# Patient Record
Sex: Male | Born: 1960 | Race: White | Hispanic: No | Smoking: Never smoker
Health system: Southern US, Community
[De-identification: ages and names within clinical notes are randomized; demographics above are authoritative.]

## PROBLEM LIST (undated history)

## (undated) DIAGNOSIS — B1920 Unspecified viral hepatitis C without hepatic coma: Secondary | ICD-10-CM

## (undated) DIAGNOSIS — M549 Dorsalgia, unspecified: Secondary | ICD-10-CM

## (undated) DIAGNOSIS — B1910 Unspecified viral hepatitis B without hepatic coma: Secondary | ICD-10-CM

## (undated) DIAGNOSIS — F1020 Alcohol dependence, uncomplicated: Secondary | ICD-10-CM

## (undated) HISTORY — PX: HERNIA REPAIR: SHX51

---

## 2010-07-27 ENCOUNTER — Emergency Department: Payer: Self-pay | Admitting: Emergency Medicine

## 2010-10-24 ENCOUNTER — Emergency Department: Payer: Self-pay | Admitting: Unknown Physician Specialty

## 2010-11-20 ENCOUNTER — Ambulatory Visit: Payer: Self-pay | Admitting: Specialist

## 2011-04-04 ENCOUNTER — Emergency Department: Payer: Self-pay | Admitting: *Deleted

## 2011-06-29 ENCOUNTER — Emergency Department: Payer: Self-pay | Admitting: Unknown Physician Specialty

## 2011-07-03 ENCOUNTER — Emergency Department: Payer: Self-pay | Admitting: Emergency Medicine

## 2011-10-02 ENCOUNTER — Emergency Department: Payer: Self-pay | Admitting: Emergency Medicine

## 2012-01-25 ENCOUNTER — Emergency Department: Payer: Self-pay | Admitting: *Deleted

## 2012-01-25 LAB — DRUG SCREEN, URINE
Amphetamines, Ur Screen: NEGATIVE (ref ?–1000)
Benzodiazepine, Ur Scrn: NEGATIVE (ref ?–200)
Cannabinoid 50 Ng, Ur ~~LOC~~: NEGATIVE (ref ?–50)
Cocaine Metabolite,Ur ~~LOC~~: POSITIVE (ref ?–300)
MDMA (Ecstasy)Ur Screen: NEGATIVE (ref ?–500)
Methadone, Ur Screen: NEGATIVE (ref ?–300)
Phencyclidine (PCP) Ur S: NEGATIVE (ref ?–25)
Tricyclic, Ur Screen: NEGATIVE (ref ?–1000)

## 2012-01-25 LAB — COMPREHENSIVE METABOLIC PANEL
Alkaline Phosphatase: 57 U/L (ref 50–136)
Bilirubin,Total: 0.4 mg/dL (ref 0.2–1.0)
Chloride: 105 mmol/L (ref 98–107)
Co2: 26 mmol/L (ref 21–32)
Creatinine: 0.93 mg/dL (ref 0.60–1.30)
EGFR (African American): >60
EGFR (Non-African Amer.): >60
Osmolality: 279 (ref 275–301)
Potassium: 3.5 mmol/L (ref 3.5–5.1)

## 2012-01-25 LAB — CBC
HGB: 15.5 g/dL (ref 13.0–18.0)
MCH: 32.1 pg (ref 26.0–34.0)
MCHC: 33.5 g/dL (ref 32.0–36.0)
Platelet: 180 10*3/uL (ref 150–440)
RBC: 4.84 10*6/uL (ref 4.40–5.90)
RDW: 13.4 % (ref 11.5–14.5)
WBC: 7.2 10*3/uL (ref 3.8–10.6)

## 2012-01-25 LAB — ETHANOL
Ethanol %: 0.159 % — ABNORMAL HIGH (ref 0.000–0.080)
Ethanol: 159 mg/dL

## 2012-01-25 LAB — SALICYLATE LEVEL: Salicylates, Serum: <1.7 mg/dL

## 2012-01-25 LAB — TSH: Thyroid Stimulating Horm: 3.51 u[IU]/mL

## 2012-09-07 IMAGING — CR DG CHEST 1V PORT
1 series · 1 of 1 positions shown · non-contrast
Comparison: none

REASON FOR EXAM: chest pain
COMMENTS:

PROCEDURE:     DXR - DXR PORTABLE CHEST SINGLE VIEW  - October 24, 2010  [DATE]
RESULT:     The lungs are clear. The cardiac silhouette and visualized bony
skeleton are unremarkable.

[view not recorded]
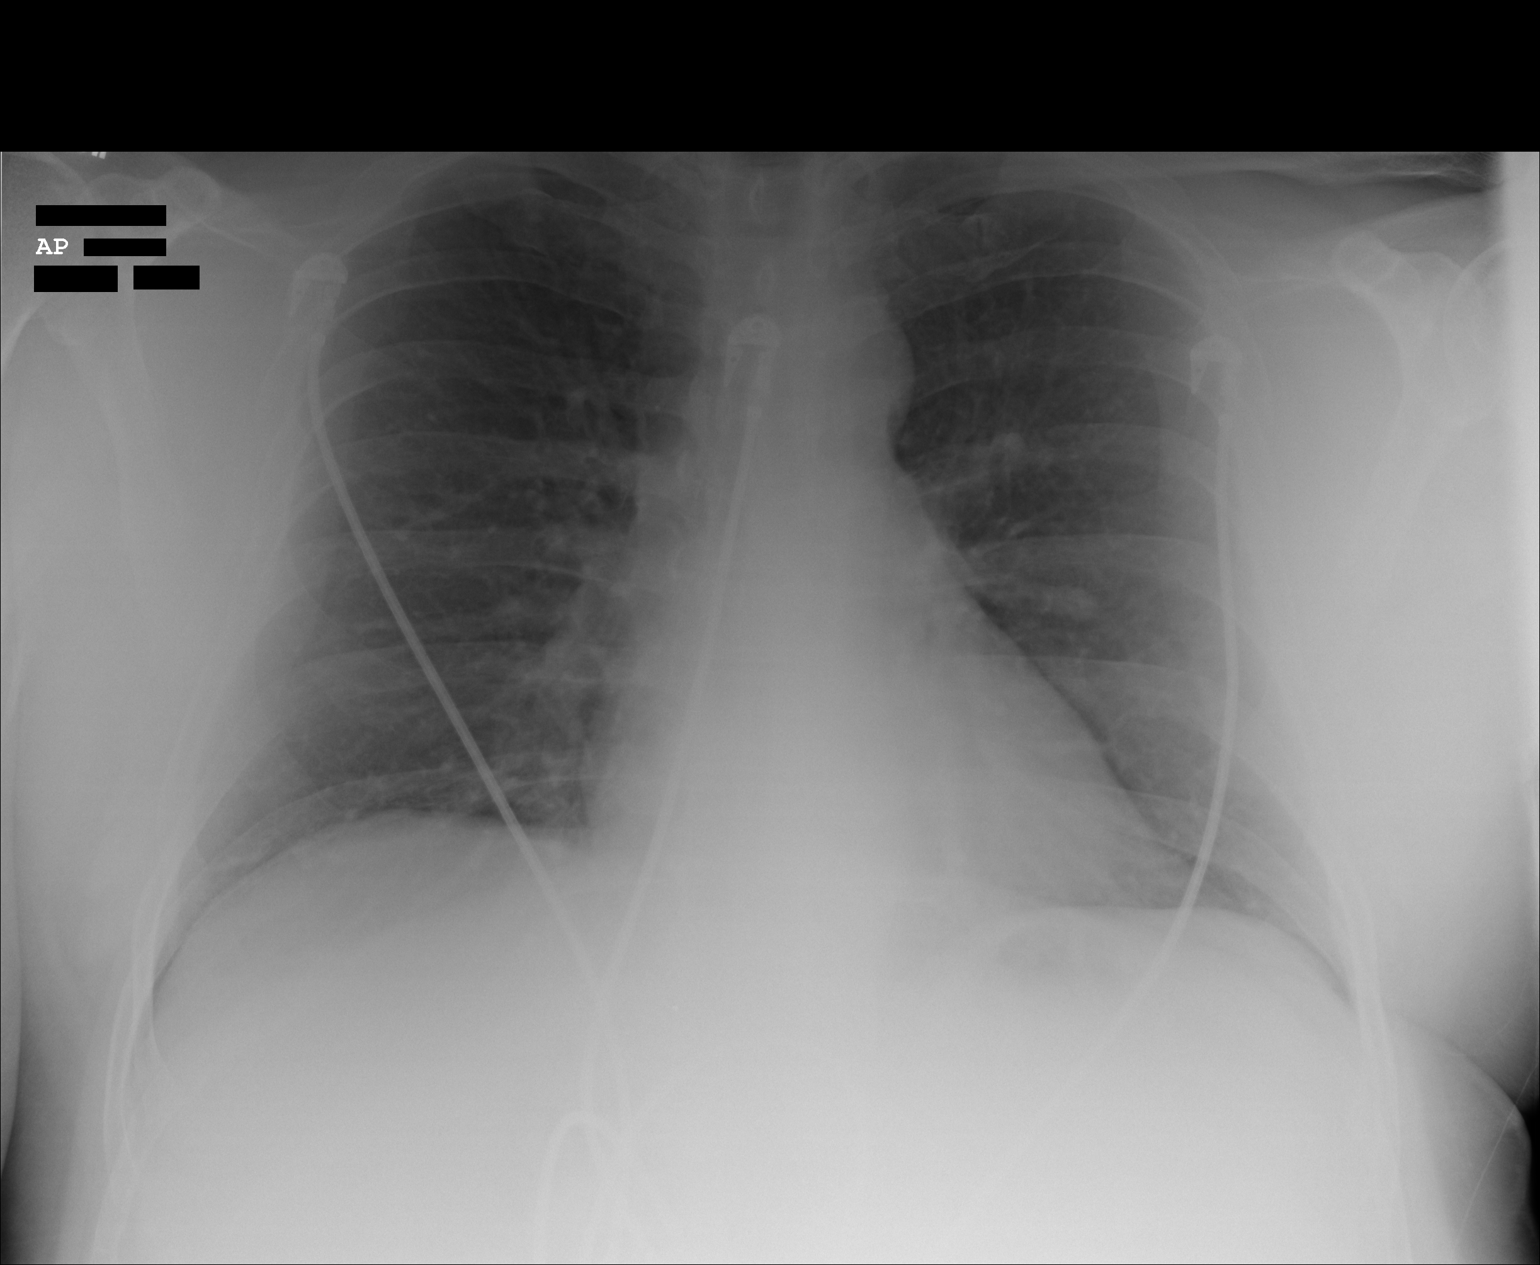

[1 of 1 positions shown; findings below may reference images not displayed]

IMPRESSION: 1. Chest radiograph without evidence of acute cardiopulmonary disease.

## 2012-09-07 IMAGING — CT CT CHEST-ABD-PELV W/ CM
1 of 2 series · 14 of 32 positions shown, 19 images · IV contrast (APPLIED)
Comparison: None

REASON FOR EXAM: (1) chest pain; (2) back pain
COMMENTS:

PROCEDURE:     CT  - CT CHEST ABDOMEN AND PELVIS W  - October 24, 2010  [DATE]
RESULT:     CT CHEST, ABDOMEN, AND PELVIS
HISTORY: Chest pain
TECHNIQUE: Multiple axial images obtained from the thoracic inlet to the
pubic symphysis, without p.o. contrast and with 100 ml of [id number]
intravenous contrast.

[Series 5: soft tissue · axial · 0.82mm/px · z∈[-704,-95]mm · 14 of 231 slices shown, 19 images]
[im 14/231  mediastinal]
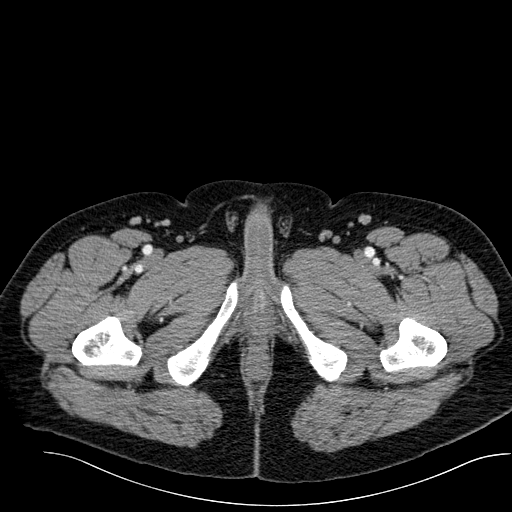
[im 14/231  bone]
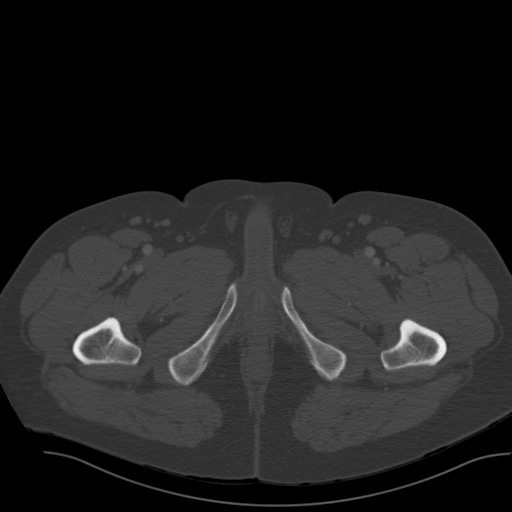
[im 41/231  mediastinal]
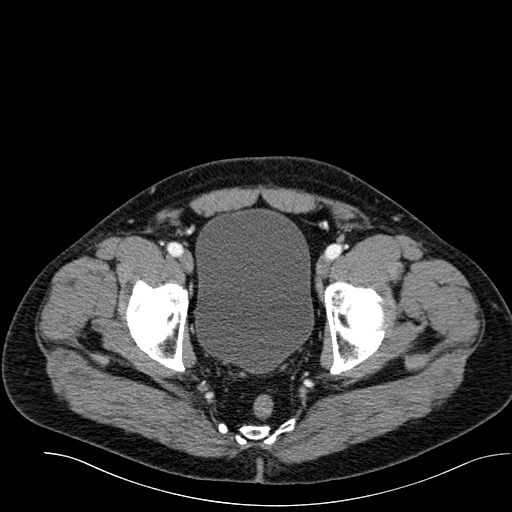
[im 55/231  mediastinal]
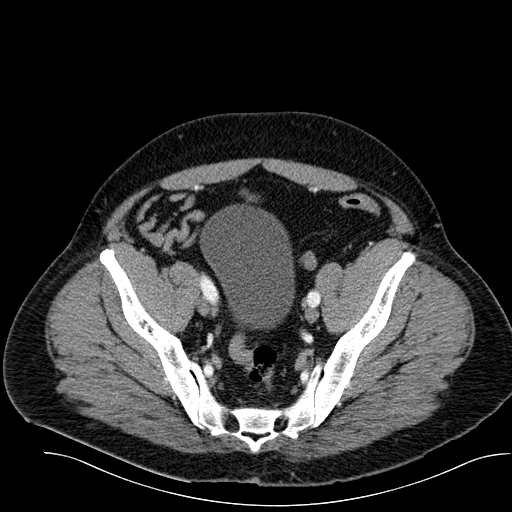
[im 77/231  mediastinal]
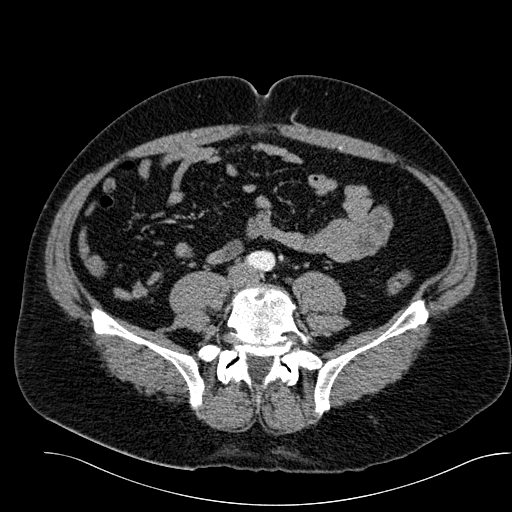
[im 82/231  mediastinal]
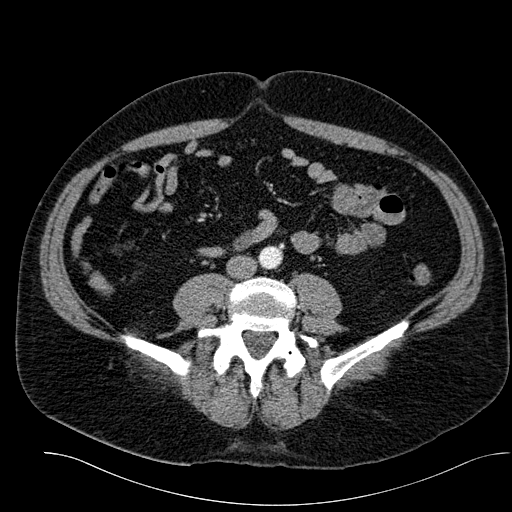
[im 109/231  mediastinal]
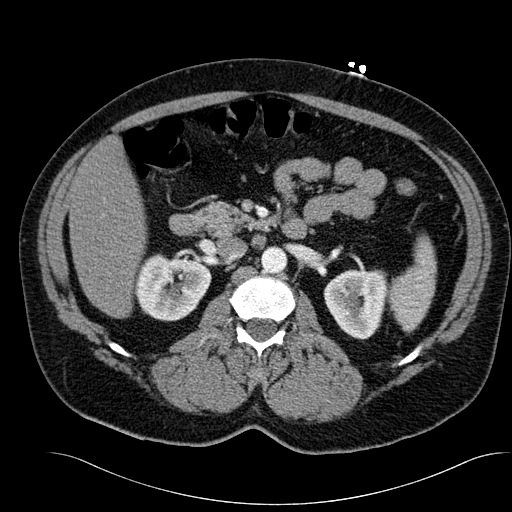
[im 113/231  mediastinal]
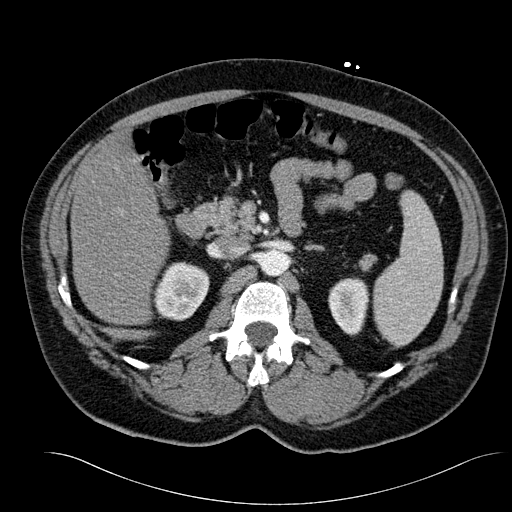
[im 122/231  mediastinal]
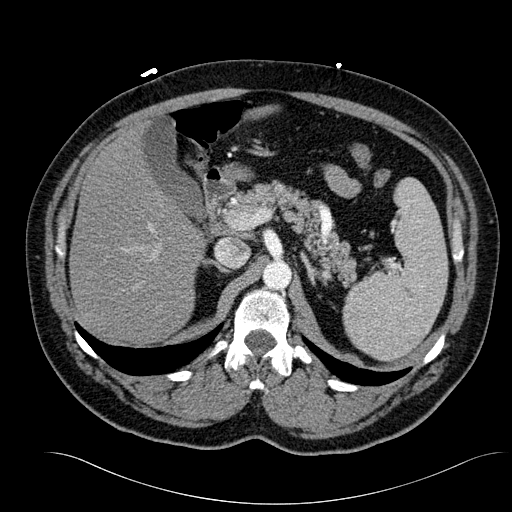
[im 149/231  mediastinal]
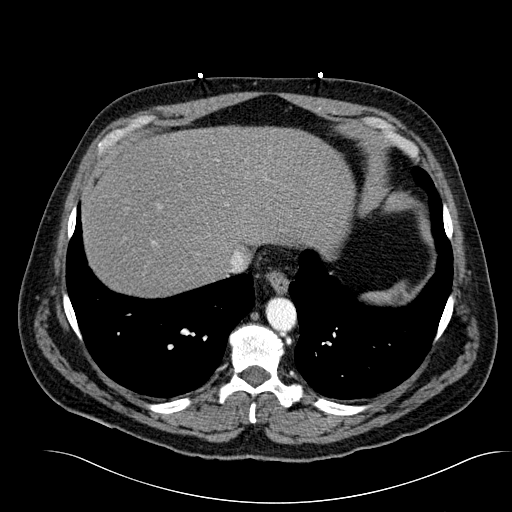
[im 149/231  bone]
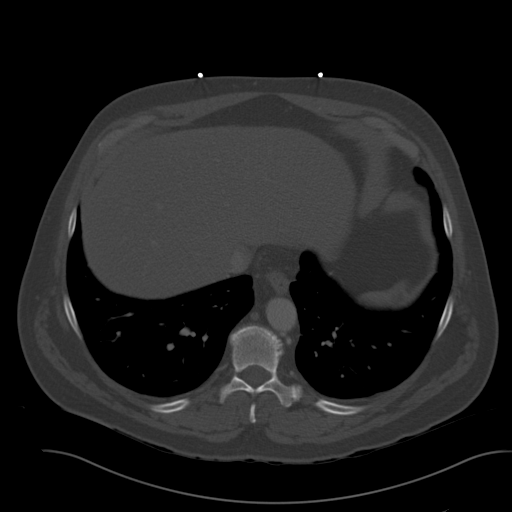
[im 154/231  mediastinal]
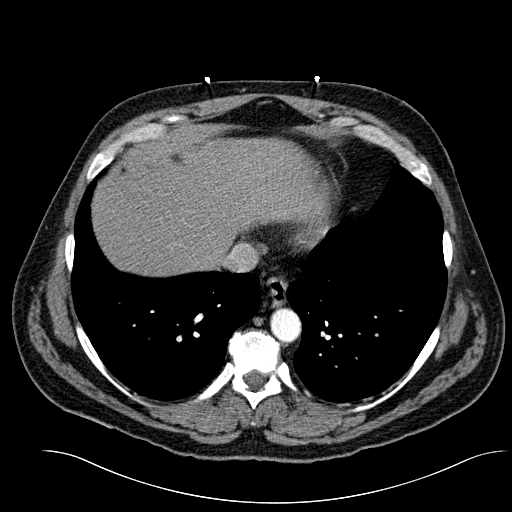
[im 176/231  mediastinal]
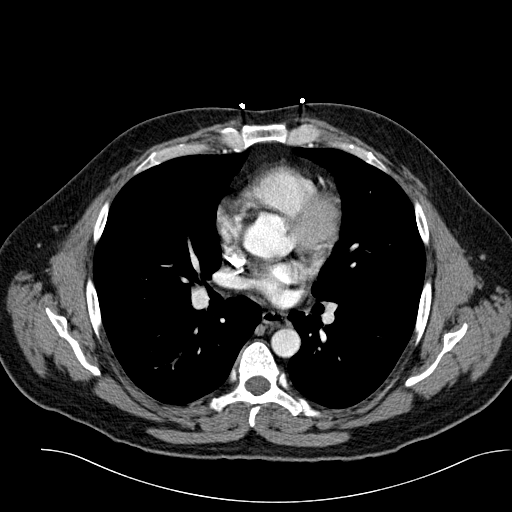
[im 176/231  lung]
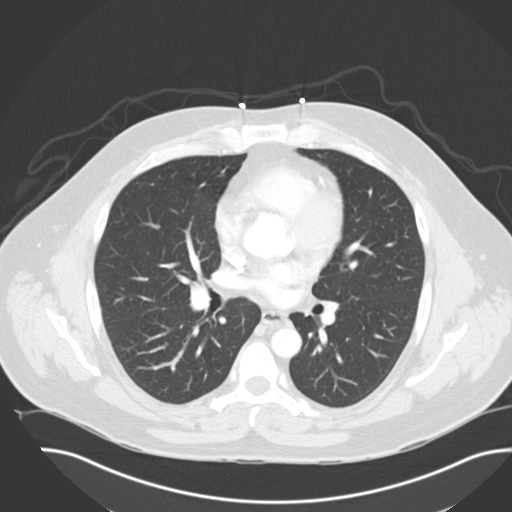
[im 190/231  mediastinal]
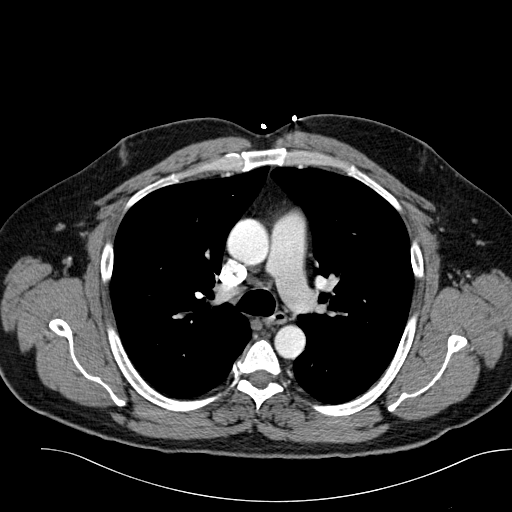
[im 190/231  lung]
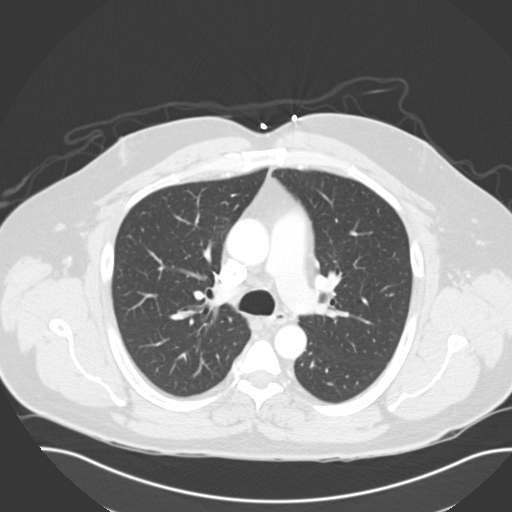
[im 203/231  lung]
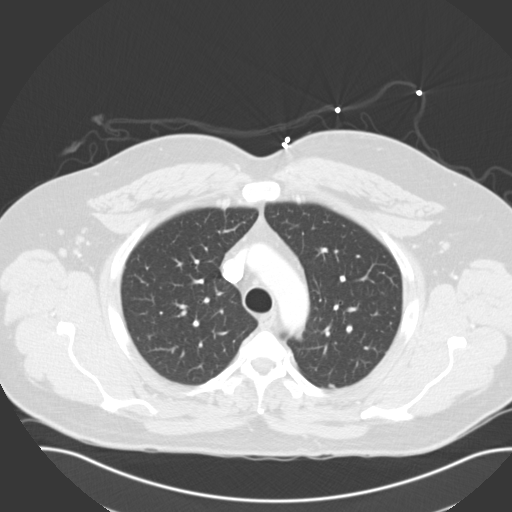
[im 217/231  mediastinal]
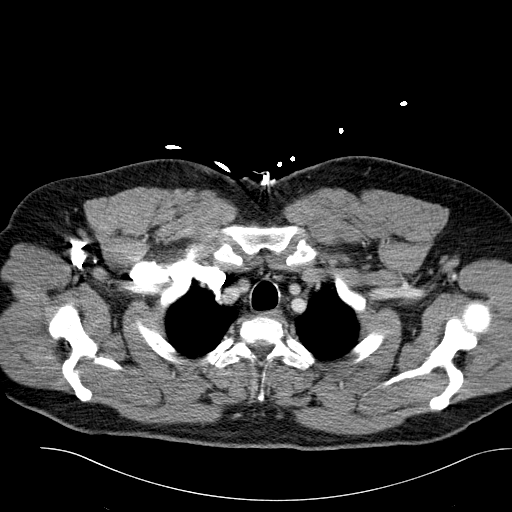
[im 217/231  lung]
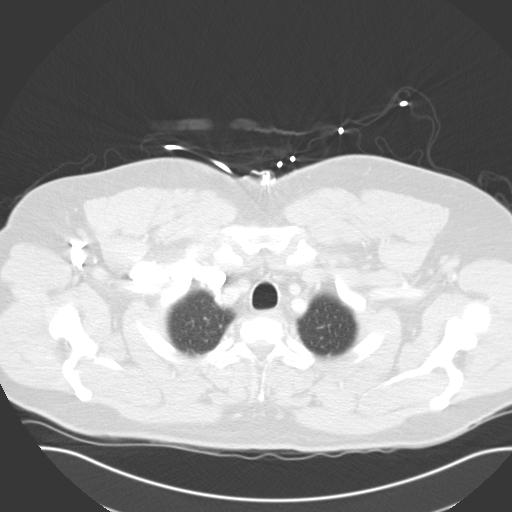

[14 of 32 positions shown; findings below may reference images not displayed]

FINDINGS: CHEST:

The lungs are clear. There is no focal mass. There is no focal parenchymal
opacity, pleural effusion, or pneumothorax.

The heart size is normal. There is no pericardial effusion.

There are no pathologically enlarged mediastinal, hilar, or axillary lymph
nodes.

The osseous structures demonstrate no focal abnormality.

ABDOMEN/PELVIS:

The liver is diffusely low in attenuation likely secondary to hepatic
steatosis. There is no intrahepatic or extrahepatic biliary ductal
dilatation. The gallbladder is unremarkable. The spleen demonstrates no
focal abnormality. The kidneys, adrenal glands, pancreas are normal. The
bladder is unremarkable.

The  unopacified stomach, duodenum, small intestine, and large intestine
demonstrate no gross abnormality, but evaluation is limited secondary to
lack of enteric contrast. There is no pneumoperitoneum, pneumatosis, or
portal venous gas. There is no abdominal or pelvic free fluid. There is no
lymphadenopathy.

The abdominal aorta is normal in caliber .

There is grade 1 anterolisthesis of L5 on S1. There is severe facet
arthropathy at L5-S1. There is bilateral foraminal narrowing at L5-S1.
IMPRESSION: 1. No acute disease of the chest, abdomen or pelvis.

2. Grade 1 anterolisthesis of L5 on S1 likely secondary to severe bilateral
facet arthropathy. There is bilateral severe foraminal narrowing at L5-S1.
If there is persistent concern regarding radiculopathy recommend further
evaluation with MRI.

3. Hepatic steatosis.

## 2015-11-13 ENCOUNTER — Emergency Department: Payer: Medicaid Other

## 2015-11-13 ENCOUNTER — Observation Stay
Admission: EM | Admit: 2015-11-13 | Discharge: 2015-11-14 | Disposition: A | Discharge status: Home / Self Care | Payer: Medicaid Other | Attending: Internal Medicine | Admitting: Internal Medicine

## 2015-11-13 DIAGNOSIS — R0902 Hypoxemia: Secondary | ICD-10-CM

## 2015-11-13 DIAGNOSIS — T50901A Poisoning by unspecified drugs, medicaments and biological substances, accidental (unintentional), initial encounter: Secondary | ICD-10-CM | POA: Diagnosis present

## 2015-11-13 DIAGNOSIS — R4189 Other symptoms and signs involving cognitive functions and awareness: Secondary | ICD-10-CM

## 2015-11-13 DIAGNOSIS — Z9889 Other specified postprocedural states: Secondary | ICD-10-CM | POA: Insufficient documentation

## 2015-11-13 DIAGNOSIS — B1920 Unspecified viral hepatitis C without hepatic coma: Secondary | ICD-10-CM | POA: Insufficient documentation

## 2015-11-13 DIAGNOSIS — T401X1A Poisoning by heroin, accidental (unintentional), initial encounter: Secondary | ICD-10-CM | POA: Insufficient documentation

## 2015-11-13 DIAGNOSIS — Y9259 Other trade areas as the place of occurrence of the external cause: Secondary | ICD-10-CM | POA: Insufficient documentation

## 2015-11-13 DIAGNOSIS — J9601 Acute respiratory failure with hypoxia: Secondary | ICD-10-CM | POA: Diagnosis not present

## 2015-11-13 DIAGNOSIS — N179 Acute kidney failure, unspecified: Secondary | ICD-10-CM | POA: Diagnosis present

## 2015-11-13 DIAGNOSIS — F10929 Alcohol use, unspecified with intoxication, unspecified: Secondary | ICD-10-CM | POA: Diagnosis present

## 2015-11-13 DIAGNOSIS — R404 Transient alteration of awareness: Secondary | ICD-10-CM | POA: Diagnosis present

## 2015-11-13 DIAGNOSIS — R0689 Other abnormalities of breathing: Secondary | ICD-10-CM

## 2015-11-13 DIAGNOSIS — E876 Hypokalemia: Secondary | ICD-10-CM | POA: Diagnosis present

## 2015-11-13 DIAGNOSIS — T40601A Poisoning by unspecified narcotics, accidental (unintentional), initial encounter: Secondary | ICD-10-CM | POA: Diagnosis present

## 2015-11-13 HISTORY — DX: Dorsalgia, unspecified: M54.9

## 2015-11-13 HISTORY — DX: Unspecified viral hepatitis C without hepatic coma: B19.20

## 2015-11-13 LAB — CBC WITH DIFFERENTIAL/PLATELET
Basophils Absolute: 0.1 10*3/uL (ref 0–0.1)
Basophils Relative: 1 %
EOS ABS: 0.2 10*3/uL (ref 0–0.7)
Eosinophils Relative: 2 %
HEMATOCRIT: 44.1 % (ref 40.0–52.0)
HEMOGLOBIN: 14.9 g/dL (ref 13.0–18.0)
LYMPHS ABS: 5.6 10*3/uL — AB (ref 1.0–3.6)
Lymphocytes Relative: 52 %
MCH: 33.5 pg (ref 26.0–34.0)
MCHC: 33.8 g/dL (ref 32.0–36.0)
MCV: 98.9 fL (ref 80.0–100.0)
MONO ABS: 1.2 10*3/uL — AB (ref 0.2–1.0)
MONOS PCT: 11 %
Neutro Abs: 3.6 10*3/uL (ref 1.4–6.5)
Neutrophils Relative %: 34 %
Platelets: 174 10*3/uL (ref 150–440)
RBC: 4.46 MIL/uL (ref 4.40–5.90)
RDW: 13.6 % (ref 11.5–14.5)
WBC: 10.8 10*3/uL — ABNORMAL HIGH (ref 3.8–10.6)

## 2015-11-13 MED ORDER — SODIUM CHLORIDE 0.9 % IV BOLUS (SEPSIS)
1000.0000 mL | Freq: Once | INTRAVENOUS | Status: AC
  Administered 2015-11-14: 1000 mL via INTRAVENOUS

## 2015-11-13 NOTE — ED Provider Notes (Signed)
[facility] Emergency Department Provider Note  ____________________________________________  Time seen: Approximately 11:46 PM  I have reviewed the triage vital signs and the nursing notes.   HISTORY  Chief Complaint Drug Overdose  History limited by patient unresponsive  HPI Oscar Poole is a 55 y.o. male brought to the ED via EMS from [facility] for unresponsive, likely secondary to heroin overdose.EMS was called to the local hotel where the collar reported that the patient had been using heroin and became unresponsive. On EMS arrival, patient was breathing 2 times per minute. EMS gave 2 mg Narcan intranasally. Upon IV placement, EMS then gave 1 mg IV Narcan with good response. Patient arrives to the emergency department breathing spontaneously and arousable to verbal stimuli. When questioned, patient denies drug use and complains of chronic back pain. Further details unobtainable secondary to decreased level of consciousness.   Past medical history Unknown  There are no active problems to display for this patient.   No past surgical history on file.  No current outpatient prescriptions on file.  Allergies Review of patient's allergies indicates no known allergies.  No family history on file.  Social History Social History  Substance Use Topics  . Smoking status: Not on file  . Smokeless tobacco: Not on file  . Alcohol Use: Not on file    Review of Systems  Constitutional: No fever/chills Eyes: No visual changes. ENT: No sore throat. Cardiovascular: Denies chest pain. Respiratory: Denies shortness of breath. Gastrointestinal: No abdominal pain.  No nausea, no vomiting.  No diarrhea.  No constipation. Genitourinary: Negative for dysuria. Musculoskeletal: Positive for back pain. Skin: Negative for rash. Neurological: Negative for headaches, focal weakness or numbness.  Limited secondary to decreased level of consciousness; 10-point ROS  otherwise negative.  ____________________________________________   PHYSICAL EXAM:  VITAL SIGNS: ED Triage Vitals  Enc Vitals Group     BP 11/13/15 2343 118/90 mmHg     Pulse Rate 11/13/15 2343 94     Resp 11/13/15 2343 24     Temp --      Temp src --      SpO2 11/13/15 2341 90 %     Weight --      Height --      Head Cir --      Peak Flow --      Pain Score --      Pain Loc --      Pain Edu? --      Excl. in GC? --     Constitutional: Somnolent. Responsive to voice. Moaning. Eyes: Conjunctivae are normal. EOMI. Right eye appears chronically clouded. Left pupil pinpoint and reactive. Head: Atraumatic. Nose: No congestion/rhinnorhea. Mouth/Throat: Mucous membranes are moist.  Oropharynx non-erythematous. + gag ref;ex. Neck: No stridor.  No cervical spine tenderness to palpation. No step-offs or deformities noted. Cardiovascular: Normal rate, regular rhythm. Grossly normal heart sounds.  Good peripheral circulation. Respiratory: Increased respiratory effort.  No retractions. Lungs CTAB. Gastrointestinal: Soft and nontender. No distention. No abdominal bruits. No CVA tenderness. Musculoskeletal: No lower extremity tenderness nor edema.  No joint effusions. Neurologic:  Normal speech and language. No gross focal neurologic deficits are appreciated. MAEWx4. Skin:  Skin is warm, dry and intact. No rash noted. Psychiatric: Mood and affect are normal. Speech and behavior are normal.  ____________________________________________   LABS (all labs ordered are listed, but only abnormal results are displayed)  Labs Reviewed  CBC WITH DIFFERENTIAL/PLATELET - Abnormal; Notable for the following:  WBC 10.8 (*)    Lymphs Abs 5.6 (*)    Monocytes Absolute 1.2 (*)    All other components within normal limits  COMPREHENSIVE METABOLIC PANEL - Abnormal; Notable for the following:    Sodium 131 (*)    Potassium 2.2 (*)    CO2 16 (*)    Glucose, Bld 236 (*)    Creatinine, Ser 1.29  (*)    Calcium 8.1 (*)    AST 63 (*)    ALT 68 (*)    All other components within normal limits  ETHANOL - Abnormal; Notable for the following:    Alcohol, Ethyl (B) 245 (*)    All other components within normal limits  ACETAMINOPHEN LEVEL - Abnormal; Notable for the following:    Acetaminophen (Tylenol), Serum <10 (*)    All other components within normal limits  AMMONIA - Abnormal; Notable for the following:    Ammonia 54 (*)    All other components within normal limits  URINE DRUG SCREEN, QUALITATIVE ([facility] ONLY) - Abnormal; Notable for the following:    Opiate, Ur Screen POSITIVE (*)    All other components within normal limits  BLOOD GAS, ARTERIAL - Abnormal; Notable for the following:    pH, Arterial 7.25 (*)    pO2, Arterial 136 (*)    Bicarbonate 19.3 (*)    Acid-base deficit 7.8 (*)    All other components within normal limits  SALICYLATE LEVEL  TROPONIN I   ____________________________________________  EKG  ED ECG REPORT I, Yazlyn Wentzel J, the attending physician, personally viewed and interpreted this ECG.   Date: 11/14/2015  EKG Time: 2340  Rate: 90  Rhythm: normal EKG, normal sinus rhythm  Axis: Normal  Intervals:none  ST&T Change: Nonspecific  ____________________________________________  RADIOLOGY  CT head without contrast interpreted per Dr. Karie Kirks: Focal subcentimeter density at superior aspect of 3rd ventricle likely represents confluence of the internal cerebral veins, as seen on prior CT neck, less likely blood products though as a conservative measure, consider 6 hour follow-up noncontrast CT head.  Portable chest x-ray (viewed by me, interpreted per Dr. Andria Meuse): Cardiac enlargement with central pulmonary vascular congestion and mild perihilar edema. ____________________________________________   PROCEDURES  Procedure(s) performed: None  Critical Care performed: Yes, see critical care note(s)  CRITICAL CARE Performed by: Irean Hong   Total critical care time: 30 minutes  Critical care time was exclusive of separately billable procedures and treating other patients.  Critical care was necessary to treat or prevent imminent or life-threatening deterioration.  Critical care was time spent personally by me on the following activities: development of treatment plan with patient and/or surrogate as well as nursing, discussions with consultants, evaluation of patient's response to treatment, examination of patient, obtaining history from patient or surrogate, ordering and performing treatments and interventions, ordering and review of laboratory studies, ordering and review of radiographic studies, pulse oximetry and re-evaluation of patient's condition. ____________________________________________   INITIAL IMPRESSION / ASSESSMENT AND PLAN / ED COURSE  Pertinent labs & imaging results that were available during my care of the patient were reviewed by me and considered in my medical decision making (see chart for details).  55 year old male who presents from a local motel unresponsive, likely secondary to heroin overdose. After EMS administration of Narcan, patient arrives somnolent but arousable, with intact gag reflex, answering questions when stimulated and moaning. Will obtain stat CT head to evaluate for intracranial abnormalities, obtain screening lab work including toxicological screen, initiate IV fluid resuscitation  and further Narcan as needed.  ----------------------------------------- 1:20 AM on 11/14/2015 -----------------------------------------  Patient had good response after additional 0.4 mg Narcan. He was able to visit with his brother for a short time and felt probably back to sleep. Laboratory and radiology results notable for severe hypokalemia. Discussed with hospitalist to evaluate patient in the emergency department for admission. ____________________________________________   FINAL CLINICAL  IMPRESSION(S) / ED DIAGNOSES  Final diagnoses:  Heroin overdose, accidental or unintentional, initial encounter  Hypokalemia  Unresponsive  Hypoxia  Respiratory depression      Irean HongJade J Nida Manfredi, MD 11/14/15 [id number]

## 2015-11-13 NOTE — ED Notes (Signed)
[facility].  [facility] [address] individual with pt reports apparent overdose on heroin, EMS gave 2mg  Narcan 1mg  per nostril. And 1mg  Narcan per IV and pt began breathing on his own. Pt found with 2 RR per minute and 90% on NRB.    Pt now responsive to painful stimuli withdrawing and moaning.

## 2015-11-14 DIAGNOSIS — N179 Acute kidney failure, unspecified: Secondary | ICD-10-CM | POA: Diagnosis present

## 2015-11-14 DIAGNOSIS — E876 Hypokalemia: Secondary | ICD-10-CM | POA: Diagnosis present

## 2015-11-14 DIAGNOSIS — F10929 Alcohol use, unspecified with intoxication, unspecified: Secondary | ICD-10-CM | POA: Diagnosis present

## 2015-11-14 DIAGNOSIS — T50901A Poisoning by unspecified drugs, medicaments and biological substances, accidental (unintentional), initial encounter: Secondary | ICD-10-CM | POA: Diagnosis present

## 2015-11-14 DIAGNOSIS — T40601A Poisoning by unspecified narcotics, accidental (unintentional), initial encounter: Secondary | ICD-10-CM | POA: Diagnosis present

## 2015-11-14 LAB — COMPREHENSIVE METABOLIC PANEL
ALT: 68 U/L — ABNORMAL HIGH (ref 17–63)
ANION GAP: 12 (ref 5–15)
AST: 63 U/L — ABNORMAL HIGH (ref 15–41)
Albumin: 4.2 g/dL (ref 3.5–5.0)
Alkaline Phosphatase: 38 U/L (ref 38–126)
BUN: 11 mg/dL (ref 6–20)
CHLORIDE: 103 mmol/L (ref 101–111)
CO2: 16 mmol/L — AB (ref 22–32)
Calcium: 8.1 mg/dL — ABNORMAL LOW (ref 8.9–10.3)
Creatinine, Ser: 1.29 mg/dL — ABNORMAL HIGH (ref 0.61–1.24)
GFR calc Af Amer: >60 mL/min (ref >60)
GFR calc non Af Amer: >60 mL/min (ref >60)
GLUCOSE: 236 mg/dL — AB (ref 65–99)
POTASSIUM: 2.2 mmol/L — AB (ref 3.5–5.1)
SODIUM: 131 mmol/L — AB (ref 135–145)
Total Bilirubin: 1.1 mg/dL (ref 0.3–1.2)
Total Protein: 7.9 g/dL (ref 6.5–8.1)

## 2015-11-14 LAB — URINE DRUG SCREEN, QUALITATIVE (ARMC ONLY)
Amphetamines, Ur Screen: NOT DETECTED
Barbiturates, Ur Screen: NOT DETECTED
Benzodiazepine, Ur Scrn: NOT DETECTED
Cannabinoid 50 Ng, Ur ~~LOC~~: NOT DETECTED
Cocaine Metabolite,Ur ~~LOC~~: NOT DETECTED
MDMA (Ecstasy)Ur Screen: NOT DETECTED
METHADONE SCREEN, URINE: NOT DETECTED
Opiate, Ur Screen: POSITIVE — AB
Phencyclidine (PCP) Ur S: NOT DETECTED
TRICYCLIC, UR SCREEN: NOT DETECTED

## 2015-11-14 LAB — BLOOD GAS, ARTERIAL
ACID-BASE DEFICIT: 7.8 mmol/L — AB (ref 0.0–2.0)
Bicarbonate: 19.3 mEq/L — ABNORMAL LOW (ref 21.0–28.0)
FIO2: 1
O2 Saturation: 98.7 %
PCO2 ART: 44 mmHg (ref 32.0–48.0)
PH ART: 7.25 — AB (ref 7.350–7.450)
Patient temperature: 37
pO2, Arterial: 136 mmHg — ABNORMAL HIGH (ref 83.0–108.0)

## 2015-11-14 LAB — MRSA PCR SCREENING: MRSA BY PCR: NEGATIVE

## 2015-11-14 LAB — BASIC METABOLIC PANEL
Anion gap: 4 — ABNORMAL LOW (ref 5–15)
BUN: 9 mg/dL (ref 6–20)
CO2: 23 mmol/L (ref 22–32)
CREATININE: 0.98 mg/dL (ref 0.61–1.24)
Calcium: 8.1 mg/dL — ABNORMAL LOW (ref 8.9–10.3)
Chloride: 108 mmol/L (ref 101–111)
GFR calc Af Amer: >60 mL/min (ref >60)
GLUCOSE: 86 mg/dL (ref 65–99)
Potassium: 5 mmol/L (ref 3.5–5.1)
SODIUM: 135 mmol/L (ref 135–145)

## 2015-11-14 LAB — ETHANOL: Alcohol, Ethyl (B): 245 mg/dL — ABNORMAL HIGH (ref <5)

## 2015-11-14 LAB — ACETAMINOPHEN LEVEL: Acetaminophen (Tylenol), Serum: <10 ug/mL — ABNORMAL LOW (ref 10–30)

## 2015-11-14 LAB — AMMONIA: Ammonia: 54 umol/L — ABNORMAL HIGH (ref 9–35)

## 2015-11-14 LAB — GLUCOSE, CAPILLARY: GLUCOSE-CAPILLARY: 79 mg/dL (ref 65–99)

## 2015-11-14 LAB — SALICYLATE LEVEL

## 2015-11-14 LAB — TROPONIN I

## 2015-11-14 MED ORDER — VITAMIN B-1 100 MG PO TABS
100.0000 mg | ORAL_TABLET | Freq: Every day | ORAL | Status: DC
  Administered 2015-11-14: 100 mg via ORAL
  Filled 2015-11-14: qty 1

## 2015-11-14 MED ORDER — LORAZEPAM 0.5 MG PO TABS
1.0000 mg | ORAL_TABLET | Freq: Four times a day (QID) | ORAL | Status: DC | PRN
Start: 2015-11-14 — End: 2015-11-14

## 2015-11-14 MED ORDER — NALOXONE HCL 2 MG/2ML IJ SOSY
0.4000 mg | PREFILLED_SYRINGE | Freq: Once | INTRAMUSCULAR | Status: AC
  Administered 2015-11-14: 0.4 mg via INTRAVENOUS
  Filled 2015-11-14: qty 2

## 2015-11-14 MED ORDER — ENOXAPARIN SODIUM 40 MG/0.4ML ~~LOC~~ SOLN: 40.0000 mg | SUBCUTANEOUS | Status: DC

## 2015-11-14 MED ORDER — ACETAMINOPHEN 650 MG RE SUPP: 650.0000 mg | Freq: Four times a day (QID) | RECTAL | Status: DC | PRN

## 2015-11-14 MED ORDER — ONDANSETRON HCL 4 MG PO TABS: 4.0000 mg | ORAL_TABLET | Freq: Four times a day (QID) | ORAL | Status: DC | PRN

## 2015-11-14 MED ORDER — THIAMINE HCL 100 MG PO TABS: 100.0000 mg | ORAL_TABLET | Freq: Every day | ORAL | Status: DC

## 2015-11-14 MED ORDER — POTASSIUM CHLORIDE 10 MEQ/100ML IV SOLN
10.0000 meq | INTRAVENOUS | Status: AC
  Administered 2015-11-14 (×4): 10 meq via INTRAVENOUS
  Filled 2015-11-14 (×4): qty 100

## 2015-11-14 MED ORDER — LORAZEPAM 2 MG/ML IJ SOLN
1.0000 mg | Freq: Four times a day (QID) | INTRAMUSCULAR | Status: DC | PRN
Start: 2015-11-14 — End: 2015-11-14

## 2015-11-14 MED ORDER — FOLIC ACID 1 MG PO TABS: 1.0000 mg | ORAL_TABLET | Freq: Every day | ORAL | Status: DC

## 2015-11-14 MED ORDER — SODIUM CHLORIDE 0.9% FLUSH: 3.0000 mL | Freq: Two times a day (BID) | INTRAVENOUS | Status: DC

## 2015-11-14 MED ORDER — FOLIC ACID 1 MG PO TABS
1.0000 mg | ORAL_TABLET | Freq: Every day | ORAL | Status: DC
  Administered 2015-11-14: 1 mg via ORAL
  Filled 2015-11-14: qty 1

## 2015-11-14 MED ORDER — ADULT MULTIVITAMIN W/MINERALS CH
1.0000 | ORAL_TABLET | Freq: Every day | ORAL | Status: DC
  Administered 2015-11-14: 1 via ORAL
  Filled 2015-11-14: qty 1

## 2015-11-14 MED ORDER — LORAZEPAM 2 MG/ML IJ SOLN: 0.0000 mg | Freq: Two times a day (BID) | INTRAMUSCULAR | Status: DC

## 2015-11-14 MED ORDER — POTASSIUM CHLORIDE 2 MEQ/ML IV SOLN
INTRAVENOUS | Status: DC
  Administered 2015-11-14: 01:00:00 via INTRAVENOUS
  Filled 2015-11-14 (×4): qty 1000

## 2015-11-14 MED ORDER — THIAMINE HCL 100 MG/ML IJ SOLN: 100.0000 mg | Freq: Every day | INTRAMUSCULAR | Status: DC

## 2015-11-14 MED ORDER — LORAZEPAM 2 MG/ML IJ SOLN: 0.0000 mg | Freq: Four times a day (QID) | INTRAMUSCULAR | Status: DC

## 2015-11-14 MED ORDER — ACETAMINOPHEN 325 MG PO TABS: 650.0000 mg | ORAL_TABLET | Freq: Four times a day (QID) | ORAL | Status: DC | PRN

## 2015-11-14 MED ORDER — ONDANSETRON HCL 4 MG/2ML IJ SOLN: 4.0000 mg | Freq: Four times a day (QID) | INTRAMUSCULAR | Status: DC | PRN

## 2015-11-14 NOTE — Progress Notes (Signed)
Patient is arousable to voice and is oriented x4.  Vital signs have been stable and the patient is resting comfortably on 4L [place] with no signs of distress and no complaints of pain.  Will continue to monitor.

## 2015-11-14 NOTE — ED Notes (Signed)
Patient becoming more alert. Resting comfortably. Taken off NRB and placed on 4L [place]. Tolerating well at this time. Brother was here. States he will pick up patient when discharged. Name and number put in demographics.

## 2015-11-14 NOTE — Progress Notes (Signed)
Patient given discharge instructions. Verbalizes understanding and denies questions. IV's discontinued, see flow sheet. Pt. Refused wheelchair and left ambulatory from unit with friend to drive him home.

## 2015-11-14 NOTE — Discharge Summary (Signed)
[facility] - [place] [facility]   PATIENT NAME: Oscar Poole    MR#:  [id number]  DATE OF BIRTH:  1961-05-31  DATE OF ADMISSION:  11/13/2015 ADMITTING PHYSICIAN: Oralia Manis, MD  DATE OF DISCHARGE: No discharge date for patient encounter.  PRIMARY CARE PHYSICIAN: [name], MD    ADMISSION DIAGNOSIS:  Hypokalemia [E87.6] Hypoxia [R09.02] Unresponsive [R40.4] Respiratory depression [R06.89] Heroin overdose, accidental or unintentional, initial encounter [T40.1X1A]  DISCHARGE DIAGNOSIS:  Acute respiratory failure with hypoxia-resolved Heroin overdose accidental  SECONDARY DIAGNOSIS:   Past Medical History  Diagnosis Date  . Hepatitis C   . Back pain     HOSPITAL COURSE:  Oscar Poole  is a 55 y.o. male admitted 11/13/2015 with chief complaint unresponsiveness. Please see H&P performed by Dr. Anne Hahn for further information. Patient was found unresponsive at a [employer] after "snorting a small amount of herion" he received Narcan 2 in the emergency department with improvement. Fortunately overnight he had no further symptoms since mental status is back at baseline longer requires oxygen his condition is stable and improved from the day of discharge. Discussed potential dangers of alcohol as well as drug use: Total Counseling 6 minutes  DISCHARGE CONDITIONS:   Stable  CONSULTS OBTAINED:  Treatment Team:  Wyatt Haste, MD  DRUG ALLERGIES:  No Known Allergies  DISCHARGE MEDICATIONS:   Current Discharge Medication List    START taking these medications   Details  folic acid (FOLVITE) 1 MG tablet Take 1 tablet (1 mg total) by mouth daily. Qty: 30 tablet, Refills: 0    thiamine 100 MG tablet Take 1 tablet (100 mg total) by mouth daily. Qty: 30 tablet, Refills: 0         DISCHARGE INSTRUCTIONS:    DIET:  Regular diet  DISCHARGE CONDITION:  Stable  ACTIVITY:  Activity as tolerated  OXYGEN:  Home Oxygen: No.   Oxygen  Delivery: room air  DISCHARGE LOCATION:  home   If you experience worsening of your admission symptoms, develop shortness of breath, life threatening emergency, suicidal or homicidal thoughts you must seek medical attention immediately by calling 911 or calling your MD immediately  if symptoms less severe.  You Must read complete instructions/literature along with all the possible adverse reactions/side effects for all the Medicines you take and that have been prescribed to you. Take any new Medicines after you have completely understood and accpet all the possible adverse reactions/side effects.   Please note  You were cared for by a hospitalist during your hospital stay. If you have any questions about your discharge medications or the care you received while you were in the hospital after you are discharged, you can call the unit and asked to speak with the hospitalist on call if the hospitalist that took care of you is not available. Once you are discharged, your primary care physician will handle any further medical issues. Please note that NO REFILLS for any discharge medications will be authorized once you are discharged, as it is imperative that you return to your primary care physician (or establish a relationship with a primary care physician if you do not have one) for your aftercare needs so that they can reassess your need for medications and monitor your lab values.    On the day of Discharge:   VITAL SIGNS:  Blood pressure 149/102, pulse 70, temperature 97.9 F (36.6 C), temperature source Oral, resp. rate 15, height 6' (1.829 m), weight 113.399 kg (  250 lb), SpO2 94 %.  I/O:   Intake/Output Summary (Last 24 hours) at 11/14/15 1026 Last data filed at 11/14/15 0752  Gross per 24 hour  Intake 891.67 ml  Output   1250 ml  Net -358.33 ml    PHYSICAL EXAMINATION:  GENERAL:  55 y.o.-year-old patient lying in the bed with no acute distress.  EYES: Pupils equal, round,  reactive to light and accommodation. No scleral icterus. Extraocular muscles intact.  HEENT: Head atraumatic, normocephalic. Oropharynx and nasopharynx clear.  NECK:  Supple, no jugular venous distention. No thyroid enlargement, no tenderness.  LUNGS: Normal breath sounds bilaterally, no wheezing, rales,rhonchi or crepitation. No use of accessory muscles of respiration.  CARDIOVASCULAR: S1, S2 normal. No murmurs, rubs, or gallops.  ABDOMEN: Soft, non-tender, non-distended. Bowel sounds present. No organomegaly or mass.  EXTREMITIES: No pedal edema, cyanosis, or clubbing.  NEUROLOGIC: Cranial nerves II through XII are intact. Muscle strength 5/5 in all extremities. Sensation intact. Gait not checked.  PSYCHIATRIC: The patient is alert and oriented x 3.  SKIN: No obvious rash, lesion, or ulcer.   DATA REVIEW:   CBC  Recent Labs Lab 11/13/15 2344  WBC 10.8*  HGB 14.9  HCT 44.1  PLT 174    Chemistries   Recent Labs Lab 11/13/15 2344 11/14/15 0801  NA 131* 135  K 2.2* 5.0  CL 103 108  CO2 16* 23  GLUCOSE 236* 86  BUN 11 9  CREATININE 1.29* 0.98  CALCIUM 8.1* 8.1*  AST 63*  --   ALT 68*  --   ALKPHOS 38  --   BILITOT 1.1  --     Cardiac Enzymes  Recent Labs Lab 11/13/15 2344  TROPONINI <0.03    Microbiology Results  Results for orders placed or performed during the hospital encounter of 11/13/15  MRSA PCR Screening     Status: None   Collection Time: 11/14/15  3:48 AM  Result Value Ref Range Status   MRSA by PCR NEGATIVE NEGATIVE Final    Comment:        The GeneXpert MRSA Assay (FDA approved for NASAL specimens only), is one component of a comprehensive MRSA colonization surveillance program. It is not intended to diagnose MRSA infection nor to guide or monitor treatment for MRSA infections.     RADIOLOGY:  Ct Head Wo Contrast  11/14/2015  CLINICAL DATA:  Heroin overdose.  Unresponsive. EXAM: CT HEAD WITHOUT CONTRAST TECHNIQUE: Contiguous axial  images were obtained from the base of the skull through the vertex without intravenous contrast. COMPARISON:  CT neck January 25, 2012 FINDINGS: INTRACRANIAL CONTENTS: The ventricles and sulci are normal. No intraparenchymal hemorrhage, mass effect nor midline shift. No acute large vascular territory infarcts. No abnormal extra-axial fluid collections. Focal subcentimeter density along superior aspect of 3rd ventricle, axial 17/34. Basal cisterns are patent. ORBITS: Status post apparent RIGHT ocular lens implant. SINUSES: LEFT maxillary mucosal retention cyst without paranasal sinus air-fluid levels. Mastoid air cells are well aerated. SKULL/SOFT TISSUES: No skull fracture. No significant soft tissue swelling. IMPRESSION: Focal subcentimeter density at superior aspect of 3rd ventricle likely represents confluence of the internal cerebral veins, as seen on prior CT neck, less likely blood products though as a conservative measure, consider 6 hour follow-up noncontrast CT head. Electronically Signed   By: Awilda Metro M.D.   On: 11/14/2015 00:20   Dg Chest Port 1 View  11/14/2015  CLINICAL DATA:  Unresponsive patient. EXAM: PORTABLE CHEST 1 VIEW COMPARISON:  10/24/2010 FINDINGS: Shallow  inspiration. Cardiac enlargement with mild central pulmonary vascular congestion and perihilar infiltration suggesting perihilar edema. No blunting of costophrenic angles. No pneumothorax. IMPRESSION: Cardiac enlargement with central pulmonary vascular congestion and mild perihilar edema. Electronically Signed   By: Burman NievesWilliam  Stevens M.D.   On: 11/14/2015 00:23     Management plans discussed with the patient, family and they are in agreement.  CODE STATUS:     Code Status Orders        Start     Ordered   11/14/15 0350  Full code   Continuous     11/14/15 0349    Code Status History    Date Active Date Inactive Code Status Order ID Comments User Context   This patient has a current code status but no historical  code status.      TOTAL TIME TAKING CARE OF THIS PATIENT: 31 minutes.    Oscar Poole,  Mardi MainlandDavid K M.D on 11/14/2015 at 10:26 AM  Between 7am to 6pm - Pager - [phone]  After 6pm go to [email or web address] - password [email or web address] [facility]  [facility]  Office  [phone]  CC: Primary care physician; Ambrose MantleWILLET, GLENN R, MD

## 2015-11-14 NOTE — H&P (Addendum)
[facility] [place] [facility]   PATIENT NAME: Oscar Poole    MR#:  [id number]  DATE OF BIRTH:  06-Nov-1960  DATE OF ADMISSION:  11/13/2015  PRIMARY CARE PHYSICIAN: [name], MD   REQUESTING/REFERRING PHYSICIAN: Dolores Frame, MD  CHIEF COMPLAINT:   Chief Complaint  Patient presents with  . Drug Overdose    HISTORY OF PRESENT ILLNESS:  Nicola Quesnell  is a 55 y.o. male who presents with Unresponsive state. Patient [place] [address], had decrease risk for status when found by EMS. Suspicion for urinary overdose. EMS gave him Narcan with good response in his mental status, transiently. Here in the ED was found to be toxic screen positive for opiates, as well as having an elevated alcohol level. He received another dose of Narcan in the ED. He was able to wean from nonrebreather to 4 L nasal cannula. He is hemodynamically stable. Hospitalists were called for admission given his persistent altered state.  PAST MEDICAL HISTORY:   Past Medical History  Diagnosis Date  . Hepatitis C   . Back pain     PAST SURGICAL HISTORY:   Past Surgical History  Procedure Laterality Date  . Hernia repair      SOCIAL HISTORY:   Social History  Substance Use Topics  . Smoking status: Unknown If Ever Smoked  . Smokeless tobacco: Not on file  . Alcohol Use: 0.0 oz/week    0 Standard drinks or equivalent per week     Comment: Unknown quantity    FAMILY HISTORY:  No family history on file.   Unable to obtain family history from patient due to his altered mental state DRUG ALLERGIES:  No Known Allergies  MEDICATIONS AT HOME:   Prior to Admission medications   Not on File    REVIEW OF SYSTEMS:  Review of Systems  Unable to perform ROS: acuity of condition     VITAL SIGNS:   Filed Vitals:   11/14/15 0015 11/14/15 0030 11/14/15 0045 11/14/15 0100  BP:  105/77 105/71 101/75  Pulse: 87 87 89 90  Resp: Height:      Weight:       SpO2: 91% 91% 90% 94%   Wt Readings from Last 3 Encounters:  11/13/15 113.399 kg (250 lb)    PHYSICAL EXAMINATION:  Physical Exam  Vitals reviewed. Constitutional: He appears well-developed and well-nourished. No distress.  HENT:  Head: Normocephalic and atraumatic.  Mouth/Throat: Oropharynx is clear and moist.  Eyes: Conjunctivae are normal. No scleral icterus.  Left pupil larger than right, both dilated, left pupil sluggishly reactive  Neck: Normal range of motion. Neck supple. No JVD present. No thyromegaly present.  Cardiovascular: Normal rate, regular rhythm and intact distal pulses.  Exam reveals no gallop and no friction rub.   No murmur heard. Respiratory: Effort normal and breath sounds normal. No respiratory distress. He has no wheezes. He has no rales.  GI: Soft. Bowel sounds are normal. He exhibits no distension. There is no tenderness.  Musculoskeletal: Normal range of motion. He exhibits no edema.  No arthritis, no gout  Lymphadenopathy:    He has no cervical adenopathy.  Neurological:  Unable to assess due to patient's mental state  Skin: Skin is warm and dry. No rash noted. No erythema.  Psychiatric:  Unable to assess due to patient's mental state    LABORATORY PANEL:   CBC  Recent Labs Lab 11/13/15 2344  WBC 10.8*  HGB 14.9  HCT 44.1  PLT 174   ------------------------------------------------------------------------------------------------------------------  Chemistries   Recent Labs Lab 11/13/15 2344  NA 131*  K 2.2*  CL 103  CO2 16*  GLUCOSE 236*  BUN 11  CREATININE 1.29*  CALCIUM 8.1*  AST 63*  ALT 68*  ALKPHOS 38  BILITOT 1.1   ------------------------------------------------------------------------------------------------------------------  Cardiac Enzymes  Recent Labs Lab 11/13/15 2344  TROPONINI <0.03    ------------------------------------------------------------------------------------------------------------------  RADIOLOGY:  Ct Head Wo Contrast  11/14/2015  CLINICAL DATA:  Heroin overdose.  Unresponsive. EXAM: CT HEAD WITHOUT CONTRAST TECHNIQUE: Contiguous axial images were obtained from the base of the skull through the vertex without intravenous contrast. COMPARISON:  CT neck January 25, 2012 FINDINGS: INTRACRANIAL CONTENTS: The ventricles and sulci are normal. No intraparenchymal hemorrhage, mass effect nor midline shift. No acute large vascular territory infarcts. No abnormal extra-axial fluid collections. Focal subcentimeter density along superior aspect of 3rd ventricle, axial 17/34. Basal cisterns are patent. ORBITS: Status post apparent RIGHT ocular lens implant. SINUSES: LEFT maxillary mucosal retention cyst without paranasal sinus air-fluid levels. Mastoid air cells are well aerated. SKULL/SOFT TISSUES: No skull fracture. No significant soft tissue swelling. IMPRESSION: Focal subcentimeter density at superior aspect of 3rd ventricle likely represents confluence of the internal cerebral veins, as seen on prior CT neck, less likely blood products though as a conservative measure, consider 6 hour follow-up noncontrast CT head. Electronically Signed   By: Awilda Metroourtnay  Bloomer M.D.   On: 11/14/2015 00:20   Dg Chest Port 1 View  11/14/2015  CLINICAL DATA:  Unresponsive patient. EXAM: PORTABLE CHEST 1 VIEW COMPARISON:  10/24/2010 FINDINGS: Shallow inspiration. Cardiac enlargement with mild central pulmonary vascular congestion and perihilar infiltration suggesting perihilar edema. No blunting of costophrenic angles. No pneumothorax. IMPRESSION: Cardiac enlargement with central pulmonary vascular congestion and mild perihilar edema. Electronically Signed   By: Burman NievesWilliam  Stevens M.D.   On: 11/14/2015 00:23    EKG:   Orders placed or performed during the hospital encounter of 11/13/15  . EKG 12-Lead   . EKG 12-Lead  . ED EKG  . ED EKG    IMPRESSION AND PLAN:  Principal Problem:   Opiate overdose - with subsequent unresponsiveness today. Responded transiently to Narcan. Respiratory and hemodynamic stability. Active Problems:   Alcohol intoxication (HCC) - likely contributed to his unresponsive state as well. Admit with CIWA protocol   Hypokalemia - IV replacement ordered, as well as potassium in his IV fluids. Monitor closely.   AKI (acute kidney injury) (HCC) - fluids for hydration, monitor for expected improvement, avoid nephrotoxins.  All the records are reviewed and case discussed with ED provider. Management plans discussed with the patient and/or family.  DVT PROPHYLAXIS: SubQ lovenox  GI PROPHYLAXIS: None  ADMISSION STATUS: Inpatient  CODE STATUS: Full Code Status History    This patient does not have a recorded code status. Please follow your organizational policy for patients in this situation.      TOTAL TIME TAKING CARE OF THIS PATIENT:  minutes.    Kaylon Hitz FIELDING 11/14/2015, 2:18 AM  [facility]  Office  [phone]  CC: Primary care physician; Ambrose MantleWILLET, GLENN R, MD

## 2016-11-29 ENCOUNTER — Emergency Department
Admission: EM | Admit: 2016-11-29 | Discharge: 2016-11-29 | Discharge status: Absent without leave | Payer: Medicaid Other

## 2017-02-23 ENCOUNTER — Encounter: Payer: Self-pay | Admitting: Emergency Medicine

## 2017-02-23 ENCOUNTER — Emergency Department
Admission: EM | Admit: 2017-02-23 | Discharge: 2017-02-24 | Disposition: A | Discharge status: Home / Self Care | Payer: Medicaid Other | Attending: Emergency Medicine | Admitting: Emergency Medicine

## 2017-02-23 DIAGNOSIS — R1110 Vomiting, unspecified: Secondary | ICD-10-CM | POA: Diagnosis present

## 2017-02-23 DIAGNOSIS — F10920 Alcohol use, unspecified with intoxication, uncomplicated: Secondary | ICD-10-CM | POA: Diagnosis not present

## 2017-02-23 DIAGNOSIS — N179 Acute kidney failure, unspecified: Secondary | ICD-10-CM | POA: Diagnosis not present

## 2017-02-23 DIAGNOSIS — IMO0002 Reserved for concepts with insufficient information to code with codable children: Secondary | ICD-10-CM

## 2017-02-23 DIAGNOSIS — R112 Nausea with vomiting, unspecified: Secondary | ICD-10-CM

## 2017-02-23 LAB — CBC WITH DIFFERENTIAL/PLATELET
Basophils Absolute: 0 10*3/uL (ref 0–0.1)
Basophils Relative: 1 %
EOS PCT: 1 %
Eosinophils Absolute: 0 10*3/uL (ref 0–0.7)
HEMATOCRIT: 42.1 % (ref 40.0–52.0)
Hemoglobin: 14.7 g/dL (ref 13.0–18.0)
LYMPHS ABS: 1.5 10*3/uL (ref 1.0–3.6)
LYMPHS PCT: 39 %
MCH: 36.4 pg — AB (ref 26.0–34.0)
MCHC: 34.9 g/dL (ref 32.0–36.0)
MCV: 104.5 fL — AB (ref 80.0–100.0)
Monocytes Absolute: 0.4 10*3/uL (ref 0.2–1.0)
Monocytes Relative: 11 %
NEUTROS ABS: 1.8 10*3/uL (ref 1.4–6.5)
Neutrophils Relative %: 48 %
PLATELETS: 131 10*3/uL — AB (ref 150–440)
RBC: 4.03 MIL/uL — AB (ref 4.40–5.90)
RDW: 13.9 % (ref 11.5–14.5)
WBC: 3.8 10*3/uL (ref 3.8–10.6)

## 2017-02-23 LAB — PROTIME-INR
INR: 1.19
PROTHROMBIN TIME: 15.2 s (ref 11.4–15.2)

## 2017-02-23 LAB — COMPREHENSIVE METABOLIC PANEL
ALK PHOS: 54 U/L (ref 38–126)
ALT: 64 U/L — AB (ref 17–63)
AST: 133 U/L — ABNORMAL HIGH (ref 15–41)
Albumin: 3.7 g/dL (ref 3.5–5.0)
Anion gap: 9 (ref 5–15)
BUN: 6 mg/dL (ref 6–20)
CHLORIDE: 104 mmol/L (ref 101–111)
CO2: 26 mmol/L (ref 22–32)
CREATININE: 1.09 mg/dL (ref 0.61–1.24)
Calcium: 9 mg/dL (ref 8.9–10.3)
GFR calc non Af Amer: >60 mL/min (ref >60)
Glucose, Bld: 130 mg/dL — ABNORMAL HIGH (ref 65–99)
Potassium: 3.2 mmol/L — ABNORMAL LOW (ref 3.5–5.1)
Sodium: 139 mmol/L (ref 135–145)
Total Bilirubin: 1 mg/dL (ref 0.3–1.2)
Total Protein: 7.7 g/dL (ref 6.5–8.1)

## 2017-02-23 LAB — LIPASE, BLOOD: Lipase: 31 U/L (ref 11–51)

## 2017-02-23 LAB — URINALYSIS, COMPLETE (UACMP) WITH MICROSCOPIC
BACTERIA UA: NONE SEEN
Bilirubin Urine: NEGATIVE
Glucose, UA: NEGATIVE mg/dL
Hgb urine dipstick: NEGATIVE
Ketones, ur: NEGATIVE mg/dL
Leukocytes, UA: NEGATIVE
Nitrite: NEGATIVE
PH: 6 (ref 5.0–8.0)
Protein, ur: NEGATIVE mg/dL
SPECIFIC GRAVITY, URINE: 1.011 (ref 1.005–1.030)
SQUAMOUS EPITHELIAL / LPF: NONE SEEN

## 2017-02-23 LAB — ETHANOL: ALCOHOL ETHYL (B): 242 mg/dL — AB (ref <5)

## 2017-02-23 LAB — TROPONIN I

## 2017-02-23 MED ORDER — PANTOPRAZOLE SODIUM 40 MG PO TBEC: 40.0000 mg | DELAYED_RELEASE_TABLET | Freq: Once | ORAL | Status: DC

## 2017-02-23 MED ORDER — PANTOPRAZOLE SODIUM 40 MG PO TBEC: 40.0000 mg | DELAYED_RELEASE_TABLET | Freq: Every day | ORAL | 1 refills | Status: DC

## 2017-02-23 MED ORDER — PANTOPRAZOLE SODIUM 40 MG IV SOLR
40.0000 mg | Freq: Once | INTRAVENOUS | Status: AC
  Administered 2017-02-23: 40 mg via INTRAVENOUS
  Filled 2017-02-23: qty 40

## 2017-02-23 MED ORDER — ONDANSETRON HCL 4 MG/2ML IJ SOLN
4.0000 mg | Freq: Once | INTRAMUSCULAR | Status: AC
  Administered 2017-02-23: 4 mg via INTRAVENOUS
  Filled 2017-02-23: qty 2

## 2017-02-23 MED ORDER — ONDANSETRON 4 MG PO TBDP: 4.0000 mg | ORAL_TABLET | Freq: Three times a day (TID) | ORAL | 0 refills | Status: DC | PRN

## 2017-02-23 NOTE — ED Notes (Addendum)
1 blue box cutter, 1 Alexie pocket knife given to security at front desk placed in safe.

## 2017-02-23 NOTE — ED Provider Notes (Signed)
[facility] Emergency Department Provider Note       Time seen: ----------------------------------------- 9:26 PM on 02/23/2017 -----------------------------------------     I have reviewed the triage vital signs and the nursing notes.   HISTORY   Chief Complaint No chief complaint on file.    HPI Oscar Poole is a 56 y.o. male who presents to the ED for vomiting. Patient states he may have seen some blood in his vomit this morning. He has not had any vomiting since this morning but has had about 4, 20 ounce beers since that time period. Patient states he drinks every day, has a history of hepatitis C. Patient states he drinks to help with his various pains that he has. He denies fevers, chills, chest pain, shortness of breath or other complaints at this time.   Past Medical History:  Diagnosis Date  . Back pain   . Hepatitis C     Patient Active Problem List   Diagnosis Date Noted  . Opiate overdose 11/14/2015  . Alcohol intoxication (HCC) 11/14/2015  . Hypokalemia 11/14/2015  . AKI (acute kidney injury) (HCC) 11/14/2015  . Overdose 11/14/2015    Past Surgical History:  Procedure Laterality Date  . HERNIA REPAIR      Allergies Patient has no known allergies.  Social History Social History  Substance Use Topics  . Smoking status: Never Smoker  . Smokeless tobacco: Not on file  . Alcohol use 0.0 oz/week     Comment: Unknown quantity    Review of Systems Constitutional: Negative for fever. Eyes: Negative for vision changes ENT:  Negative for congestion, sore throat Cardiovascular: Negative for chest pain. Respiratory: Negative for shortness of breath. Gastrointestinal: Positive for chronic abdominal pain, Positive for recent vomiting Genitourinary: Negative for dysuria. Musculoskeletal: Negative for back pain. Skin: Negative for rash. Neurological: Negative for headaches, focal weakness or numbness.  All systems  negative/normal/unremarkable except as stated in the HPI  ____________________________________________   PHYSICAL EXAM:  VITAL SIGNS: ED Triage Vitals  Enc Vitals Group     BP      Pulse      Resp      Temp      Temp src      SpO2      Weight      Height      Head Circumference      Peak Flow      Pain Score      Pain Loc      Pain Edu?      Excl. in GC?     Constitutional: Alert and oriented. Well appearing and in no distress.Patient smells heavily of alcohol Eyes: Conjunctivae are normal. Normal extraocular movements.Scarring over the right cornea ENT   Head: Normocephalic and atraumatic.   Nose: No congestion/rhinnorhea.   Mouth/Throat: Mucous membranes are moist.   Neck: No stridor. Cardiovascular: Normal rate, regular rhythm. No murmurs, rubs, or gallops. Respiratory: Normal respiratory effort without tachypnea nor retractions. Breath sounds are clear and equal bilaterally. No wheezes/rales/rhonchi. Gastrointestinal: Soft and nontender. Normal bowel sounds. Nontender ventral hernia is noted Musculoskeletal: Nontender with normal range of motion in extremities. No lower extremity tenderness nor edema. Neurologic:  Normal speech and language. No gross focal neurologic deficits are appreciated.  Skin:  Skin is warm, dry and intact. No rash noted. Psychiatric: Mood and affect are normal. Speech and behavior are normal.  ____________________________________________  ED COURSE:  Pertinent labs & imaging results that were available during my  care of the patient were reviewed by me and considered in my medical decision making (see chart for details). Patient presents for alcohol use disorder and vomiting, we will assess with labs and imaging as indicated.   Procedures ____________________________________________   LABS (pertinent positives/negatives)  Labs Reviewed  CBC WITH DIFFERENTIAL/PLATELET - Abnormal; Notable for the following:       Result Value    RBC 4.03 (*)    MCV 104.5 (*)    MCH 36.4 (*)    Platelets 131 (*)    All other components within normal limits  COMPREHENSIVE METABOLIC PANEL - Abnormal; Notable for the following:    Potassium 3.2 (*)    Glucose, Bld 130 (*)    AST 133 (*)    ALT 64 (*)    All other components within normal limits  URINALYSIS, COMPLETE (UACMP) WITH MICROSCOPIC - Abnormal; Notable for the following:    Color, Urine YELLOW (*)    APPearance CLEAR (*)    All other components within normal limits  ETHANOL - Abnormal; Notable for the following:    Alcohol, Ethyl (B) 242 (*)    All other components within normal limits  LIPASE, BLOOD  TROPONIN I  PROTIME-INR   ____________________________________________  FINAL ASSESSMENT AND PLAN  Alcohol use disorder, alcohol intoxication, vomiting  Plan: Patient's labs and imaging were dictated above. Patient had presented for vomiting but he has not had any further vomiting here. We did presumptively give him some IV Protonix although I'm not sure he ever had hematemesis. He'll be discharged with antacids and anti-medics. He is stable for outpatient follow-up.   Emily Filbert, MD   Note: This note was generated in part or whole with voice recognition software. Voice recognition is usually quite accurate but there are transcription errors that can and very often do occur. I apologize for any typographical errors that were not detected and corrected.     Emily Filbert, MD 02/23/17 2322

## 2017-02-23 NOTE — ED Triage Notes (Signed)
Pt to ED via EMS from home c/o vomiting for "a while".  Patient states abd pain n/v/d with possible blood in vomit.  Pt states drinks about 6 10% beers a day for a long time and takes goodies powder and "whatever I can get my hands on" for the pain.  States has an episode of vomiting and diarrhea every day.  Presents A&Ox4, chest rise even and unlabored.

## 2017-02-24 MED ORDER — GI COCKTAIL ~~LOC~~
30.0000 mL | Freq: Once | ORAL | Status: AC
  Administered 2017-02-24: 30 mL via ORAL
  Filled 2017-02-24: qty 30

## 2017-05-19 ENCOUNTER — Encounter: Payer: Self-pay | Admitting: Emergency Medicine

## 2017-05-19 ENCOUNTER — Emergency Department
Admission: EM | Admit: 2017-05-19 | Discharge: 2017-05-19 | Disposition: A | Discharge status: Home / Self Care | Payer: Medicaid Other | Attending: Emergency Medicine | Admitting: Emergency Medicine

## 2017-05-19 DIAGNOSIS — Z79899 Other long term (current) drug therapy: Secondary | ICD-10-CM | POA: Insufficient documentation

## 2017-05-19 DIAGNOSIS — M71121 Other infective bursitis, right elbow: Secondary | ICD-10-CM | POA: Diagnosis not present

## 2017-05-19 DIAGNOSIS — M25521 Pain in right elbow: Secondary | ICD-10-CM | POA: Diagnosis present

## 2017-05-19 MED ORDER — OXYCODONE HCL 5 MG PO TABS
5.0000 mg | ORAL_TABLET | Freq: Once | ORAL | Status: AC
  Administered 2017-05-19: 5 mg via ORAL
  Filled 2017-05-19: qty 1

## 2017-05-19 MED ORDER — OXYCODONE HCL 5 MG PO CAPS: 5.0000 mg | ORAL_CAPSULE | Freq: Four times a day (QID) | ORAL | 0 refills | Status: DC | PRN

## 2017-05-19 MED ORDER — SULFAMETHOXAZOLE-TRIMETHOPRIM 800-160 MG PO TABS
1.0000 | ORAL_TABLET | Freq: Once | ORAL | Status: AC
  Administered 2017-05-19: 1 via ORAL
  Filled 2017-05-19: qty 1

## 2017-05-19 MED ORDER — SULFAMETHOXAZOLE-TRIMETHOPRIM 800-160 MG PO TABS
1.0000 | ORAL_TABLET | Freq: Two times a day (BID) | ORAL | 0 refills | Status: DC
Start: 2017-05-19 — End: 2017-11-07

## 2017-05-19 NOTE — ED Provider Notes (Signed)
[facility] Emergency Department Provider Note   ____________________________________________   First MD Initiated Contact with Patient 05/19/17 1236     (approximate)  I have reviewed the triage vital signs and the nursing notes.   HISTORY  Chief Complaint Elbow Pain    HPI Oscar Poole is a 56 y.o. male patient complaining of swollen to the posterior aspect the right elbow. Patient stated onset of swelling was 3 days ago. Patient say he hit the elbow  getting out of the shower yesterday causing abrasion results of her redness and warmth today. Patient has a history of cellulitis/MRSA. Patient rates the pain as a 10 over 10. No palliative measures for complaint.  Past Medical History:  Diagnosis Date  . Back pain   . Hepatitis C     Patient Active Problem List   Diagnosis Date Noted  . Opiate overdose 11/14/2015  . Alcohol intoxication (HCC) 11/14/2015  . Hypokalemia 11/14/2015  . AKI (acute kidney injury) (HCC) 11/14/2015  . Overdose 11/14/2015    Past Surgical History:  Procedure Laterality Date  . HERNIA REPAIR      Prior to Admission medications   Medication Sig Start Date End Date Taking? Authorizing Provider  folic acid (FOLVITE) 1 MG tablet Take 1 tablet (1 mg total) by mouth daily. 11/14/15   Hower, Cletis Athens, MD  ondansetron (ZOFRAN ODT) 4 MG disintegrating tablet Take 1 tablet (4 mg total) by mouth every 8 (eight) hours as needed for nausea or vomiting. 02/23/17   Emily Filbert, MD  oxycodone (OXY-IR) 5 MG capsule Take 1 capsule (5 mg total) by mouth every 6 (six) hours as needed for pain. 05/19/17   Joni Reining, PA-C  pantoprazole (PROTONIX) 40 MG tablet Take 1 tablet (40 mg total) by mouth daily. 02/23/17 02/23/18  Emily Filbert, MD  sulfamethoxazole-trimethoprim (BACTRIM DS,SEPTRA DS) 800-160 MG tablet Take 1 tablet by mouth 2 (two) times daily. 05/19/17   Joni Reining, PA-C  thiamine 100 MG tablet Take 1 tablet (100  mg total) by mouth daily. 11/14/15   Hower, Cletis Athens, MD    Allergies Patient has no known allergies.  No family history on file.  Social History Social History  Substance Use Topics  . Smoking status: Never Smoker  . Smokeless tobacco: Never Used  . Alcohol use 0.0 oz/week     Comment: 6 10% alcohol beers per day    Review of Systems  Constitutional: No fever/chills Eyes: No visual changes. ENT: No sore throat. Cardiovascular: Denies chest pain. Respiratory: Denies shortness of breath. Gastrointestinal: No abdominal pain.  No nausea, no vomiting.  No diarrhea.  No constipation. Genitourinary: Negative for dysuria. Musculoskeletal: Negative for back pain. Skin: Negative for rash. Redness and swelling posterior right elbow Neurological: Negative for headaches, focal weakness or numbness. Endocrine:Hepatitis C  ____________________________________________   PHYSICAL EXAM:  VITAL SIGNS: ED Triage Vitals  Enc Vitals Group     BP 05/19/17 1210 (!) 156/102     Pulse Rate 05/19/17 1210 74     Resp 05/19/17 1210 20     Temp 05/19/17 1210 97.8 F (36.6 C)     Temp src --      SpO2 05/19/17 1210 96 %     Weight 05/19/17 1209 260 lb (117.9 kg)     Height 05/19/17 1209 6' (1.829 m)     Head Circumference --      Peak Flow --      Pain  Score 05/19/17 1212 10     Pain Loc --      Pain Edu? --      Excl. in GC? --     Constitutional: Alert and oriented. Well appearing and in no acute distress. Afebrile Cardiovascular: Normal rate, regular rhythm. Grossly normal heart sounds.  Good peripheral circulation. Respiratory: Normal respiratory effort.  No retractions. Lungs CTAB. Musculoskeletal: No obvious deformity to the right elbow. He didn't edematous and erythematous olecranon bursa. Abrasion posterior elbow.  Neurologic:  Normal speech and language. No gross focal neurologic deficits are appreciated. No gait instability. Skin:  Skin is warm, dry and intact. No rash  noted. Psychiatric: Mood and affect are normal. Speech and behavior are normal.  ____________________________________________   LABS (all labs ordered are listed, but only abnormal results are displayed)  Labs Reviewed - No data to display ____________________________________________  EKG   ____________________________________________  RADIOLOGY  No results found.  ____________________________________________   PROCEDURES  Procedure(s) performed: None  Procedures  Critical Care performed: No  ____________________________________________   INITIAL IMPRESSION / ASSESSMENT AND PLAN / ED COURSE  Pertinent labs & imaging results that were available during my care of the patient were reviewed by me and considered in my medical decision making (see chart for details).  Pain to right elbow secondary to infected olecranon bursitis. Patient given discharge care instructions. Patient advised take medication as directed. Patient advised to contact orthopedics in 3 days with no improvement or worsening complaint.      ____________________________________________   FINAL CLINICAL IMPRESSION(S) / ED DIAGNOSES  Final diagnoses:  Infected olecranon bursa, right      NEW MEDICATIONS STARTED DURING THIS VISIT:  New Prescriptions   OXYCODONE (OXY-IR) 5 MG CAPSULE    Take 1 capsule (5 mg total) by mouth every 6 (six) hours as needed for pain.   SULFAMETHOXAZOLE-TRIMETHOPRIM (BACTRIM DS,SEPTRA DS) 800-160 MG TABLET    Take 1 tablet by mouth 2 (two) times daily.     Note:  This document was prepared using Dragon voice recognition software and may include unintentional dictation errors.    Joni Reining, PA-C 05/19/17 1307    Governor Rooks, MD 06/02/17 [phone]

## 2017-05-19 NOTE — ED Triage Notes (Signed)
Presents with pain and swelling to right elbow  denies any injury to elbow  Describes pain as "burning"type pain  Elbow is red and swollen  Hx of cellulitis in past

## 2017-05-22 ENCOUNTER — Emergency Department
Admission: EM | Admit: 2017-05-22 | Discharge: 2017-05-22 | Disposition: A | Discharge status: Home / Self Care | Payer: Medicaid Other | Attending: Emergency Medicine | Admitting: Emergency Medicine

## 2017-05-22 ENCOUNTER — Encounter: Payer: Self-pay | Admitting: Emergency Medicine

## 2017-05-22 DIAGNOSIS — Y939 Activity, unspecified: Secondary | ICD-10-CM | POA: Diagnosis not present

## 2017-05-22 DIAGNOSIS — M129 Arthropathy, unspecified: Secondary | ICD-10-CM | POA: Diagnosis present

## 2017-05-22 DIAGNOSIS — Z79899 Other long term (current) drug therapy: Secondary | ICD-10-CM | POA: Diagnosis not present

## 2017-05-22 DIAGNOSIS — M7021 Olecranon bursitis, right elbow: Secondary | ICD-10-CM | POA: Diagnosis not present

## 2017-05-22 LAB — COMPREHENSIVE METABOLIC PANEL
ALK PHOS: 54 U/L (ref 38–126)
ALT: 33 U/L (ref 17–63)
AST: 51 U/L — AB (ref 15–41)
Albumin: 4 g/dL (ref 3.5–5.0)
Anion gap: 10 (ref 5–15)
BUN: 7 mg/dL (ref 6–20)
CALCIUM: 9.4 mg/dL (ref 8.9–10.3)
CHLORIDE: 103 mmol/L (ref 101–111)
CO2: 21 mmol/L — AB (ref 22–32)
CREATININE: 1.4 mg/dL — AB (ref 0.61–1.24)
GFR calc Af Amer: >60 mL/min (ref >60)
GFR calc non Af Amer: 55 mL/min — ABNORMAL LOW (ref >60)
Glucose, Bld: 126 mg/dL — ABNORMAL HIGH (ref 65–99)
Potassium: 3.9 mmol/L (ref 3.5–5.1)
SODIUM: 134 mmol/L — AB (ref 135–145)
Total Bilirubin: 0.6 mg/dL (ref 0.3–1.2)
Total Protein: 8.2 g/dL — ABNORMAL HIGH (ref 6.5–8.1)

## 2017-05-22 LAB — CBC WITH DIFFERENTIAL/PLATELET
BASOS ABS: 0.1 10*3/uL (ref 0–0.1)
Basophils Relative: 1 %
EOS PCT: 2 %
Eosinophils Absolute: 0.1 10*3/uL (ref 0–0.7)
HCT: 41.9 % (ref 40.0–52.0)
HEMOGLOBIN: 14.5 g/dL (ref 13.0–18.0)
LYMPHS ABS: 1.7 10*3/uL (ref 1.0–3.6)
LYMPHS PCT: 28 %
MCH: 36.7 pg — ABNORMAL HIGH (ref 26.0–34.0)
MCHC: 34.6 g/dL (ref 32.0–36.0)
MCV: 105.9 fL — AB (ref 80.0–100.0)
MONOS PCT: 11 %
Monocytes Absolute: 0.6 10*3/uL (ref 0.2–1.0)
Neutro Abs: 3.5 10*3/uL (ref 1.4–6.5)
Neutrophils Relative %: 58 %
PLATELETS: 167 10*3/uL (ref 150–440)
RBC: 3.96 MIL/uL — AB (ref 4.40–5.90)
RDW: 14.4 % (ref 11.5–14.5)
WBC: 5.9 10*3/uL (ref 3.8–10.6)

## 2017-05-22 MED ORDER — HYDROCODONE-ACETAMINOPHEN 5-325 MG PO TABS
2.0000 | ORAL_TABLET | Freq: Once | ORAL | Status: AC
  Administered 2017-05-22: 2 via ORAL
  Filled 2017-05-22: qty 2

## 2017-05-22 MED ORDER — HYDROMORPHONE HCL 1 MG/ML IJ SOLN
2.0000 mg | Freq: Once | INTRAMUSCULAR | Status: AC
  Administered 2017-05-22: 2 mg via INTRAVENOUS
  Filled 2017-05-22: qty 2

## 2017-05-22 MED ORDER — INDOMETHACIN 50 MG PO CAPS: 50.0000 mg | ORAL_CAPSULE | Freq: Two times a day (BID) | ORAL | 0 refills | Status: DC

## 2017-05-22 MED ORDER — CEPHALEXIN 500 MG PO CAPS: 500.0000 mg | ORAL_CAPSULE | Freq: Four times a day (QID) | ORAL | 0 refills | Status: AC

## 2017-05-22 MED ORDER — OXYCODONE HCL 10 MG PO TABS: 10.0000 mg | ORAL_TABLET | ORAL | 0 refills | Status: DC | PRN

## 2017-05-22 NOTE — ED Notes (Signed)
Pt was advised not to drive since he received narcotics. Pt states that his brother is going to drive him home.

## 2017-05-22 NOTE — ED Triage Notes (Signed)
C/O right elbow pain and swelling.  Seen through ED last week (Thursday or Friday) and started on antibiotics (Bactrim - started on 9/28) -- patient states symptoms not improving.

## 2017-05-22 NOTE — Discharge Instructions (Signed)
Please make an appointment to follow-up with Dr. Allena Katz the orthopedic surgeon this coming Thursday for reevaluation. Take your indomethacin twice a day as prescribed and return to the emergency department for any new or worsening symptoms such as fevers, chills, worsening pain, or for any other issues whatsoever.  It was a pleasure to take care of you today, and thank you for coming to our emergency department.  If you have any questions or concerns before leaving please ask the nurse to grab me and I'm more than happy to go through your aftercare instructions again.  If you were prescribed any opioid pain medication today such as Norco, Vicodin, Percocet, morphine, hydrocodone, or oxycodone please make sure you do not drive when you are taking this medication as it can alter your ability to drive safely.  If you have any concerns once you are home that you are not improving or are in fact getting worse before you can make it to your follow-up appointment, please do not hesitate to call [phone] and come back for further evaluation.  Oscar Brittle, MD  Results for orders placed or performed during the hospital encounter of 05/22/17  CBC with Differential  Result Value Ref Range   WBC 5.9 3.8 - 10.6 K/uL   RBC 3.96 (L) 4.40 - 5.90 MIL/uL   Hemoglobin 14.5 13.0 - 18.0 g/dL   HCT 16.1 09.6 - 04.5 %   MCV 105.9 (H) 80.0 - 100.0 fL   MCH 36.7 (H) 26.0 - 34.0 pg   MCHC 34.6 32.0 - 36.0 g/dL   RDW 40.9 81.1 - 91.4 %   Platelets 167 150 - 440 K/uL   Neutrophils Relative % 58 %   Neutro Abs 3.5 1.4 - 6.5 K/uL   Lymphocytes Relative 28 %   Lymphs Abs 1.7 1.0 - 3.6 K/uL   Monocytes Relative 11 %   Monocytes Absolute 0.6 0.2 - 1.0 K/uL   Eosinophils Relative 2 %   Eosinophils Absolute 0.1 0 - 0.7 K/uL   Basophils Relative 1 %   Basophils Absolute 0.1 0 - 0.1 K/uL  Comprehensive metabolic panel  Result Value Ref Range   Sodium 134 (L) 135 - 145 mmol/L   Potassium 3.9 3.5 - 5.1 mmol/L   Chloride  103 101 - 111 mmol/L   CO2 21 (L) 22 - 32 mmol/L   Glucose, Bld 126 (H) 65 - 99 mg/dL   BUN 7 6 - 20 mg/dL   Creatinine, Ser 7.82 (H) 0.61 - 1.24 mg/dL   Calcium 9.4 8.9 - 95.6 mg/dL   Total Protein 8.2 (H) 6.5 - 8.1 g/dL   Albumin 4.0 3.5 - 5.0 g/dL   AST 51 (H) 15 - 41 U/L   ALT 33 17 - 63 U/L   Alkaline Phosphatase 54 38 - 126 U/L   Total Bilirubin 0.6 0.3 - 1.2 mg/dL   GFR calc non Af Amer 55 (L) >60 mL/min   GFR calc Af Amer >60 >60 mL/min   Anion gap 10 5 - 15

## 2017-05-22 NOTE — ED Provider Notes (Signed)
[facility] Emergency Department Provider Note  ____________________________________________   First MD Initiated Contact with Patient 05/22/17 2020     (approximate)  I have reviewed the triage vital signs and the nursing notes.   HISTORY  Chief Complaint Joint Swelling   HPI Oscar Poole is a 56 y.o. male right-hand dominant who comes to the emergency Department with roughly 1 week of worsening severe painful swelling to his right elbow. He seen in our emergency department several days ago he was diagnosed with olecranon bursitis and given oxycodone as well as Bactrim. He was told to follow-up with orthopedic surgery in 3 days however his pain worsened and he came to the emergency department today instead. He denies fevers or chills. The pain is severe in his right elbow and nonradiating. He is able to range his elbow although this hurts. He reports compliance with his medications. In addition to the above medications he says he has been taking naproxen over-the-counter.   Past Medical History:  Diagnosis Date  . Back pain   . Hepatitis C     Patient Active Problem List   Diagnosis Date Noted  . Opiate overdose ([place]) 11/14/2015  . Alcohol intoxication ([place]) 11/14/2015  . Hypokalemia 11/14/2015  . AKI (acute kidney injury) ([place]) 11/14/2015  . Overdose 11/14/2015    Past Surgical History:  Procedure Laterality Date  . HERNIA REPAIR      Prior to Admission medications   Medication Sig Start Date End Date Taking? Authorizing Provider  cephALEXin (KEFLEX) 500 MG capsule Take 1 capsule (500 mg total) by mouth 4 (four) times daily. 05/22/17 06/01/17  Darel Hong, MD  folic acid (FOLVITE) 1 MG tablet Take 1 tablet (1 mg total) by mouth daily. 11/14/15   Hower, Aaron Mose, MD  indomethacin (INDOCIN) 50 MG capsule Take 1 capsule (50 mg total) by mouth 2 (two) times daily with a meal. 05/22/17   Darel Hong, MD  ondansetron (ZOFRAN ODT) 4 MG  disintegrating tablet Take 1 tablet (4 mg total) by mouth every 8 (eight) hours as needed for nausea or vomiting. 02/23/17   Earleen Newport, MD  oxycodone (OXY-IR) 5 MG capsule Take 1 capsule (5 mg total) by mouth every 6 (six) hours as needed for pain. 05/19/17   Sable Feil, PA-C  Oxycodone HCl 10 MG TABS Take 1 tablet (10 mg total) by mouth every 4 (four) hours as needed for moderate pain. 05/22/17   Darel Hong, MD  pantoprazole (PROTONIX) 40 MG tablet Take 1 tablet (40 mg total) by mouth daily. 02/23/17 02/23/18  Earleen Newport, MD  sulfamethoxazole-trimethoprim (BACTRIM DS,SEPTRA DS) 800-160 MG tablet Take 1 tablet by mouth 2 (two) times daily. 05/19/17   Sable Feil, PA-C  thiamine 100 MG tablet Take 1 tablet (100 mg total) by mouth daily. 11/14/15   Hower, Aaron Mose, MD    Allergies Patient has no known allergies.  No family history on file.  Social History Social History  Substance Use Topics  . Smoking status: Never Smoker  . Smokeless tobacco: Never Used  . Alcohol use 0.0 oz/week     Comment: 6 10% alcohol beers per day    Review of Systems Constitutional: No fever/chills ENT: No sore throat. Cardiovascular: Denies chest pain. Respiratory: Denies shortness of breath. Gastrointestinal: No abdominal pain.  No nausea, no vomiting.  No diarrhea.  No constipation. Musculoskeletal: Negative for back pain. Neurological: Negative for headaches   ____________________________________________   PHYSICAL EXAM:  VITAL SIGNS: ED Triage Vitals  Enc Vitals Group     BP 05/22/17 1803 140/83     Pulse Rate 05/22/17 1803 96     Resp 05/22/17 1803 16     Temp 05/22/17 1803 98.4 F (36.9 C)     Temp Source 05/22/17 1803 Oral     SpO2 05/22/17 1803 95 %     Weight 05/22/17 1800 260 lb (117.9 kg)     Height 05/22/17 1800 6' (1.829 m)     Head Circumference --      Peak Flow --      Pain Score 05/22/17 1759 10     Pain Loc --      Pain Edu? --      Excl. in [place]?  --     Constitutional: alert and oriented 4 appears quite uncomfortable nontoxic no diaphoresis speaks in full clear sentences Head: Atraumatic. Nose: No congestion/rhinnorhea. Mouth/Throat: No trismus Neck: No stridor.   Cardiovascular: regular rate and rhythm Respiratory: Normal respiratory effort.  No retractions. MSK: olecranon bursitis on the right with slight erythema and warmth and some desquamation of the skin overlying no crepitus below blisters. He is able to extend his arm to nearly complete extension and flex past 90 Neurovascularly intact Neurologic:  Normal speech and language. No gross focal neurologic deficits are appreciated.  Skin:  Skin is warm, dry and intact. No rash noted.    ____________________________________________  LABS (all labs ordered are listed, but only abnormal results are displayed)  Labs Reviewed  CBC WITH DIFFERENTIAL/PLATELET - Abnormal; Notable for the following:       Result Value   RBC 3.96 (*)    MCV 105.9 (*)    MCH 36.7 (*)    All other components within normal limits  COMPREHENSIVE METABOLIC PANEL - Abnormal; Notable for the following:    Sodium 134 (*)    CO2 21 (*)    Glucose, Bld 126 (*)    Creatinine, Ser 1.40 (*)    Total Protein 8.2 (*)    AST 51 (*)    GFR calc non Af Amer 55 (*)    All other components within normal limits    blood work reviewed by me shows no white count. He does have an elevated MCV which could be consistent with alcohol abuse __________________________________________  EKG   ____________________________________________  RADIOLOGY   ____________________________________________   PROCEDURES  Procedure(s) performed: no  Procedures  Critical Care performed: no  Observation: no ____________________________________________   INITIAL IMPRESSION / ASSESSMENT AND PLAN / ED COURSE  Pertinent labs & imaging results that were available during my care of the patient were reviewed by me  and considered in my medical decision making (see chart for details).      The patient has clear right sided olecranon bursitis that may actually have a superinfection. I have a call out to orthopedic surgery for recommendations.   ----------------------------------------- 8:35 PM on 05/22/2017 -----------------------------------------  I discussed the case with on-call orthopedic surgeon Dr. Posey Pronto who recommends the patient come to his clinic this coming Thursday and met them and he will perform an incision and drainage of the bursitis. He normally would recommend against antibiotics, however as the patient has artery received Bactrim he agrees with expanding coverage Keflex for strep coverage. ____________________________________________  the patient is clearly able to range his elbow and I do not believe he has a septic joint. His pain is improved after hydromorphone and I will refill his oxycodone  as well as prescribed him indomethacin for stronger anti-inflammatory effects. He has an appointment to follow-up with orthopedic surgery in 3 days. He verbalizes understanding and agreement with the plan.  FINAL CLINICAL IMPRESSION(S) / ED DIAGNOSES  Final diagnoses:  Olecranon bursitis, right elbow      NEW MEDICATIONS STARTED DURING THIS VISIT:  Discharge Medication List as of 05/22/2017  9:42 PM    START taking these medications   Details  cephALEXin (KEFLEX) 500 MG capsule Take 1 capsule (500 mg total) by mouth 4 (four) times daily., Starting Mon 05/22/2017, Until Thu 06/01/2017, Print    indomethacin (INDOCIN) 50 MG capsule Take 1 capsule (50 mg total) by mouth 2 (two) times daily with a meal., Starting Mon 05/22/2017, Print    Oxycodone HCl 10 MG TABS Take 1 tablet (10 mg total) by mouth every 4 (four) hours as needed for moderate pain., Starting Mon 05/22/2017, Print         Note:  This document was prepared using Dragon voice recognition software and may include  unintentional dictation errors.      Darel Hong, MD 05/22/17 2233

## 2017-05-22 NOTE — ED Notes (Signed)
Pt states that his right elbow has been bothering him for a few weeks. He said it feels "hot" and it hurts. Came ED Thursday and Dr said if it got worse to come back. He was prescribed antibiotics and it hasn't gotten better.

## 2017-07-21 ENCOUNTER — Emergency Department: Payer: Medicaid Other

## 2017-07-21 ENCOUNTER — Emergency Department
Admission: EM | Admit: 2017-07-21 | Discharge: 2017-07-21 | Disposition: A | Discharge status: Home / Self Care | Payer: Medicaid Other | Attending: Student in an Organized Health Care Education/Training Program | Admitting: Student in an Organized Health Care Education/Training Program

## 2017-07-21 DIAGNOSIS — M25562 Pain in left knee: Secondary | ICD-10-CM | POA: Insufficient documentation

## 2017-07-21 DIAGNOSIS — Z79899 Other long term (current) drug therapy: Secondary | ICD-10-CM | POA: Insufficient documentation

## 2017-07-21 MED ORDER — DEXAMETHASONE SODIUM PHOSPHATE 10 MG/ML IJ SOLN
20.0000 mg | Freq: Once | INTRAMUSCULAR | Status: AC
  Administered 2017-07-21: 20 mg via INTRAMUSCULAR
  Filled 2017-07-21: qty 2

## 2017-07-21 MED ORDER — ORPHENADRINE CITRATE ER 100 MG PO TB12: 100.0000 mg | ORAL_TABLET | Freq: Two times a day (BID) | ORAL | 0 refills | Status: AC

## 2017-07-21 MED ORDER — PREDNISONE 50 MG PO TABS: ORAL_TABLET | ORAL | 0 refills | Status: DC

## 2017-07-21 MED ORDER — ORPHENADRINE CITRATE 30 MG/ML IJ SOLN
60.0000 mg | Freq: Two times a day (BID) | INTRAMUSCULAR | Status: DC
  Administered 2017-07-21: 60 mg via INTRAMUSCULAR
  Filled 2017-07-21: qty 2

## 2017-07-21 NOTE — ED Triage Notes (Signed)
Patient reports he fractured left knee 4-5 years ago, and has had continuing problems since.  Patient c/o pain/weakness in left knee, radiating up and down leg. Patient c/o pain as throbbing.

## 2017-07-21 NOTE — ED Notes (Signed)
Patient transported to X-ray 

## 2017-07-21 NOTE — ED Provider Notes (Signed)
[facility] Emergency Department Provider Note  ____________________________________________  Time seen: Approximately 8:51 PM  I have reviewed the triage vital signs and the nursing notes.   HISTORY  Chief Complaint Knee Pain (left)    HPI Oscar Poole is a 56 y.o. male with a history of opiate overdose presents to the emergency department with 10 out of 10 left knee pain.  Patient reports that he also has radiculopathy that extends from the left buttocks to the toes.  He denies changes in sensation of the lower extremity.  Patient reports that he has been "hobbling around".  He reports a history of left tibial plateau fracture approximately 4-5 years ago.  Patient reports that his knee has "never felt right".  He denies new falls or instances of trauma.   Past Medical History:  Diagnosis Date  . Back pain   . Hepatitis C     Patient Active Problem List   Diagnosis Date Noted  . Opiate overdose ([facility]) 11/14/2015  . Alcohol intoxication ([facility]) 11/14/2015  . Hypokalemia 11/14/2015  . AKI (acute kidney injury) ([facility]) 11/14/2015  . Overdose 11/14/2015    Past Surgical History:  Procedure Laterality Date  . HERNIA REPAIR      Prior to Admission medications   Medication Sig Start Date End Date Taking? Authorizing Provider  folic acid (FOLVITE) 1 MG tablet Take 1 tablet (1 mg total) by mouth daily. 11/14/15   Hower, Cletis Athens, MD  indomethacin (INDOCIN) 50 MG capsule Take 1 capsule (50 mg total) by mouth 2 (two) times daily with a meal. 05/22/17   Merrily Brittle, MD  ondansetron (ZOFRAN ODT) 4 MG disintegrating tablet Take 1 tablet (4 mg total) by mouth every 8 (eight) hours as needed for nausea or vomiting. 02/23/17   Emily Filbert, MD  orphenadrine (NORFLEX) 100 MG tablet Take 1 tablet (100 mg total) by mouth 2 (two) times daily for 5 days. 07/21/17 07/26/17  Orvil Feil, PA-C  oxycodone (OXY-IR) 5 MG capsule Take 1 capsule (5 mg total) by mouth  every 6 (six) hours as needed for pain. 05/19/17   Joni Reining, PA-C  Oxycodone HCl 10 MG TABS Take 1 tablet (10 mg total) by mouth every 4 (four) hours as needed for moderate pain. 05/22/17   Merrily Brittle, MD  pantoprazole (PROTONIX) 40 MG tablet Take 1 tablet (40 mg total) by mouth daily. 02/23/17 02/23/18  Emily Filbert, MD  predniSONE (DELTASONE) 50 MG tablet Take one 50 mg tablet once a day for 5 days. 07/21/17   Orvil Feil, PA-C  sulfamethoxazole-trimethoprim (BACTRIM DS,SEPTRA DS) 800-160 MG tablet Take 1 tablet by mouth 2 (two) times daily. 05/19/17   Joni Reining, PA-C  thiamine 100 MG tablet Take 1 tablet (100 mg total) by mouth daily. 11/14/15   Hower, Cletis Athens, MD    Allergies Patient has no known allergies.  No family history on file.  Social History Social History   Tobacco Use  . Smoking status: Never Smoker  . Smokeless tobacco: Never Used  Substance Use Topics  . Alcohol use: Yes    Alcohol/week: 0.0 oz    Comment: 6 10% alcohol beers per day  . Drug use: Yes     Review of Systems  Constitutional: No fever/chills Eyes: No visual changes. No discharge ENT: No upper respiratory complaints. Cardiovascular: no chest pain. Respiratory: no cough. No SOB. Gastrointestinal: No abdominal pain.  No nausea, no vomiting.  No diarrhea.  No constipation. Musculoskeletal: Patient has left knee pain.  Skin: Negative for rash, abrasions, lacerations, ecchymosis. Neurological: Negative for headaches, focal weakness or numbness.   ____________________________________________   PHYSICAL EXAM:  VITAL SIGNS: ED Triage Vitals [07/21/17 1949]  Enc Vitals Group     BP (!) 147/91     Pulse Rate (!) 101     Resp 18     Temp 98.8 F (37.1 C)     Temp Source Oral     SpO2 97 %     Weight 245 lb (111.1 kg)     Height 6' (1.829 m)     Head Circumference      Peak Flow      Pain Score 8     Pain Loc      Pain Edu?      Excl. in [place]?      Constitutional:  Alert and oriented. Well appearing and in no acute distress. Eyes: Conjunctivae are normal. PERRL. EOMI. Head: Atraumatic. Cardiovascular: Normal rate, regular rhythm. Normal S1 and S2.  Good peripheral circulation. Respiratory: Normal respiratory effort without tachypnea or retractions. Lungs CTAB. Good air entry to the bases with no decreased or absent breath sounds. Musculoskeletal: Left knee: Peripatellar dimpling visualized.  Negative anterior and posterior drawer test.  No laxity with MCL or LCL testing.  No popliteal fullness.  Palpable dorsalis pedis pulse, left. Neurologic:  Normal speech and language. No gross focal neurologic deficits are appreciated.  Skin:  Skin is warm, dry and intact. No rash noted. Psychiatric: Mood and affect are normal. Speech and behavior are normal. Patient exhibits appropriate insight and judgement.   ____________________________________________   LABS (all labs ordered are listed, but only abnormal results are displayed)  Labs Reviewed - No data to display ____________________________________________  EKG   ____________________________________________  RADIOLOGY Geraldo PitterI, Devonne Kitchen M Viera Okonski, personally viewed and evaluated these images (plain radiographs) as part of my medical decision making, as well as reviewing the written report by the radiologist.  Dg Knee Complete 4 Views Left  Result Date: 07/21/2017 CLINICAL DATA:  Left knee pain since a fracture 4-5 years ago. EXAM: LEFT KNEE - COMPLETE 4+ VIEW COMPARISON:  10/02/2011. FINDINGS: Further fragmentation of the previously demonstrated tibial spine fracture with very moderately large intra-articular fragment at that location. There is some deformity due to the previous lateral tibial plateau fracture, without significant change. There is mild to moderate spur formation involving all 3 joint compartments. No effusion is seen. IMPRESSION: 1. Moderately large intra-articular loose body at the location of the  previously demonstrated tibial spine fracture the central knee joint. 2. Chronic deformity of the lateral tibial plateau due to previous fracture. 3. Mild to moderate tricompartmental degenerative changes. Electronically Signed   By: Beckie SaltsSteven  Reid M.D.   On: 07/21/2017 20:26    ____________________________________________    PROCEDURES  Procedure(s) performed:    Procedures    Medications  orphenadrine (NORFLEX) injection 60 mg (not administered)  dexamethasone (DECADRON) injection 20 mg (not administered)     ____________________________________________   INITIAL IMPRESSION / ASSESSMENT AND PLAN / ED COURSE  Pertinent labs & imaging results that were available during my care of the patient were reviewed by me and considered in my medical decision making (see chart for details).  Review of the [place] CSRS was performed in accordance of the [facility] prior to dispensing any controlled drugs.    Assessment and plan Left knee pain Patient presents to the emergency department with 10 out of 10  left knee pain.  X-ray examination conducted in the emergency department is concerning for a large intra-articular loose body.  Patient was placed in a knee immobilizer.  He was given Norflex and Decadron. He was discharged with prednisone and norflex and was referred to orthopedics. Vital signs were reassuring prior to discharge.    ____________________________________________  FINAL CLINICAL IMPRESSION(S) / ED DIAGNOSES  Final diagnoses:  Acute pain of left knee      NEW MEDICATIONS STARTED DURING THIS VISIT:  ED Discharge Orders        Ordered    predniSONE (DELTASONE) 50 MG tablet     07/21/17 2047    orphenadrine (NORFLEX) 100 MG tablet  2 times daily     07/21/17 2047          This chart was dictated using voice recognition software/Dragon. Despite best efforts to proofread, errors can occur which can change the meaning. Any change was purely unintentional.    Orvil FeilWoods,  Kadince Boxley M, PA-C 07/21/17 2056    Willy Eddyobinson, Patrick, MD 07/21/17 2230

## 2017-07-21 NOTE — ED Notes (Signed)
This RN reviewed discharge instructions, follow-up care, prescriptions, cryotherapy, and need for elevation with patient. Reviewed crutch use, and use of knee immobilizer. Patient verbalized understanding of all reviewed information.  Patient stable at this time.

## 2017-09-11 ENCOUNTER — Emergency Department
Admission: EM | Admit: 2017-09-11 | Discharge: 2017-09-11 | Disposition: A | Discharge status: Home / Self Care | Payer: Medicaid Other | Attending: Emergency Medicine | Admitting: Emergency Medicine

## 2017-09-11 ENCOUNTER — Other Ambulatory Visit: Payer: Self-pay

## 2017-09-11 ENCOUNTER — Encounter: Payer: Self-pay | Admitting: Emergency Medicine

## 2017-09-11 DIAGNOSIS — I10 Essential (primary) hypertension: Secondary | ICD-10-CM | POA: Diagnosis not present

## 2017-09-11 DIAGNOSIS — F1010 Alcohol abuse, uncomplicated: Secondary | ICD-10-CM

## 2017-09-11 DIAGNOSIS — R42 Dizziness and giddiness: Secondary | ICD-10-CM | POA: Diagnosis present

## 2017-09-11 DIAGNOSIS — Z79899 Other long term (current) drug therapy: Secondary | ICD-10-CM | POA: Insufficient documentation

## 2017-09-11 DIAGNOSIS — F10129 Alcohol abuse with intoxication, unspecified: Secondary | ICD-10-CM | POA: Diagnosis not present

## 2017-09-11 DIAGNOSIS — Y908 Blood alcohol level of 240 mg/100 ml or more: Secondary | ICD-10-CM | POA: Insufficient documentation

## 2017-09-11 HISTORY — DX: Alcohol dependence, uncomplicated: F10.20

## 2017-09-11 LAB — COMPREHENSIVE METABOLIC PANEL
ALK PHOS: 52 U/L (ref 38–126)
ALT: 96 U/L — AB (ref 17–63)
AST: 116 U/L — ABNORMAL HIGH (ref 15–41)
Albumin: 4.5 g/dL (ref 3.5–5.0)
Anion gap: 11 (ref 5–15)
BUN: 8 mg/dL (ref 6–20)
CALCIUM: 8.9 mg/dL (ref 8.9–10.3)
CO2: 24 mmol/L (ref 22–32)
CREATININE: 1.06 mg/dL (ref 0.61–1.24)
Chloride: 103 mmol/L (ref 101–111)
GFR calc Af Amer: >60 mL/min (ref >60)
Glucose, Bld: 127 mg/dL — ABNORMAL HIGH (ref 65–99)
Potassium: 3.8 mmol/L (ref 3.5–5.1)
Sodium: 138 mmol/L (ref 135–145)
Total Bilirubin: 1.4 mg/dL — ABNORMAL HIGH (ref 0.3–1.2)
Total Protein: 8.4 g/dL — ABNORMAL HIGH (ref 6.5–8.1)

## 2017-09-11 LAB — CBC
HCT: 46.3 % (ref 40.0–52.0)
Hemoglobin: 15.9 g/dL (ref 13.0–18.0)
MCH: 34.4 pg — ABNORMAL HIGH (ref 26.0–34.0)
MCHC: 34.3 g/dL (ref 32.0–36.0)
MCV: 100.2 fL — ABNORMAL HIGH (ref 80.0–100.0)
PLATELETS: 164 10*3/uL (ref 150–440)
RBC: 4.62 MIL/uL (ref 4.40–5.90)
RDW: 13.5 % (ref 11.5–14.5)
WBC: 5 10*3/uL (ref 3.8–10.6)

## 2017-09-11 LAB — URINE DRUG SCREEN, QUALITATIVE (ARMC ONLY)
AMPHETAMINES, UR SCREEN: NOT DETECTED
Barbiturates, Ur Screen: NOT DETECTED
Benzodiazepine, Ur Scrn: NOT DETECTED
Cannabinoid 50 Ng, Ur ~~LOC~~: NOT DETECTED
Cocaine Metabolite,Ur ~~LOC~~: NOT DETECTED
MDMA (Ecstasy)Ur Screen: NOT DETECTED
METHADONE SCREEN, URINE: NOT DETECTED
Opiate, Ur Screen: NOT DETECTED
Phencyclidine (PCP) Ur S: NOT DETECTED
Tricyclic, Ur Screen: NOT DETECTED

## 2017-09-11 LAB — ETHANOL: ALCOHOL ETHYL (B): 280 mg/dL — AB (ref <10)

## 2017-09-11 LAB — TROPONIN I

## 2017-09-11 MED ORDER — CHLORDIAZEPOXIDE HCL 25 MG PO CAPS: ORAL_CAPSULE | ORAL | 0 refills | Status: DC

## 2017-09-11 NOTE — ED Provider Notes (Signed)
[facility] Emergency Department Provider Note   ____________________________________________   I have reviewed the triage vital signs and the nursing notes.   HISTORY  Chief Complaint Dizziness; Headache; Hypertension; and Alcohol Problem   History limited by: Not Limited   HPI Oscar Poole is a 57 y.o. male who presents to the emergency department today with primary concern for alcohol use in desire for detox.  Patient states that he drinks multiple alcoholic drinks per day.  He states he needs a couple in the mornings to get himself going.  He states that he has tried detox in the past.  Recently had a girlfriend leaving.  He states that today after he had been drinking he realized that he was dizzy.  He thought that his blood pressure might be high and got worried about the consequences of unregulated blood pressure.  He called EMS.  At the time of my exam he is feeling better.   Per medical record review patient has a history of alcohol abuse and hepatitis c.  Past Medical History:  Diagnosis Date  . Alcoholic ([facility])   . Back pain   . Hepatitis C     Patient Active Problem List   Diagnosis Date Noted  . Opiate overdose ([facility]) 11/14/2015  . Alcohol intoxication ([facility]) 11/14/2015  . Hypokalemia 11/14/2015  . AKI (acute kidney injury) ([facility]) 11/14/2015  . Overdose 11/14/2015    Past Surgical History:  Procedure Laterality Date  . HERNIA REPAIR      Prior to Admission medications   Medication Sig Start Date End Date Taking? Authorizing Provider  chlordiazePOXIDE (LIBRIUM) 25 MG capsule Day 1 - 50 mg every 6 hours Day 2 - 25 mg every 6 hours Day 3 - 25 mg twice a day Day 4- 25 mg at night 09/11/17   Phineas Semen, MD  folic acid (FOLVITE) 1 MG tablet Take 1 tablet (1 mg total) by mouth daily. 11/14/15   Hower, Cletis Athens, MD  indomethacin (INDOCIN) 50 MG capsule Take 1 capsule (50 mg total) by mouth 2 (two) times daily with a meal. 05/22/17    Merrily Brittle, MD  ondansetron (ZOFRAN ODT) 4 MG disintegrating tablet Take 1 tablet (4 mg total) by mouth every 8 (eight) hours as needed for nausea or vomiting. 02/23/17   Emily Filbert, MD  oxycodone (OXY-IR) 5 MG capsule Take 1 capsule (5 mg total) by mouth every 6 (six) hours as needed for pain. 05/19/17   Joni Reining, PA-C  Oxycodone HCl 10 MG TABS Take 1 tablet (10 mg total) by mouth every 4 (four) hours as needed for moderate pain. 05/22/17   Merrily Brittle, MD  pantoprazole (PROTONIX) 40 MG tablet Take 1 tablet (40 mg total) by mouth daily. 02/23/17 02/23/18  Emily Filbert, MD  predniSONE (DELTASONE) 50 MG tablet Take one 50 mg tablet once a day for 5 days. 07/21/17   Orvil Feil, PA-C  sulfamethoxazole-trimethoprim (BACTRIM DS,SEPTRA DS) 800-160 MG tablet Take 1 tablet by mouth 2 (two) times daily. 05/19/17   Joni Reining, PA-C  thiamine 100 MG tablet Take 1 tablet (100 mg total) by mouth daily. 11/14/15   Hower, Cletis Athens, MD    Allergies Patient has no known allergies.  History reviewed. No pertinent family history.  Social History Social History   Tobacco Use  . Smoking status: Never Smoker  . Smokeless tobacco: Never Used  Substance Use Topics  . Alcohol use: Yes  Alcohol/week: 0.0 oz    Comment: 6 10% alcohol beers per day  . Drug use: Yes    Review of Systems Constitutional: No fever/chills Eyes: No visual changes. ENT: No sore throat. Cardiovascular: Denies chest pain. Respiratory: Denies shortness of breath. Gastrointestinal: Positive for abdominal pain.  Genitourinary: Negative for dysuria. Musculoskeletal: Negative for back pain. Skin: Negative for rash. Neurological: Positive for headache.   ____________________________________________   PHYSICAL EXAM:  VITAL SIGNS: ED Triage Vitals  Enc Vitals Group     BP 09/11/17 1945 (!) 131/93     Pulse Rate 09/11/17 1945 79     Resp 09/11/17 1945 20     Temp 09/11/17 1945 97.8 F (36.6  C)     Temp Source 09/11/17 1945 Oral     SpO2 09/11/17 1945 95 %     Weight 09/11/17 1946 245 lb (111.1 kg)     Height 09/11/17 1946 6' (1.829 m)     Head Circumference --      Peak Flow --      Pain Score 09/11/17 1957 10   Constitutional: Alert and oriented. Well appearing and in no distress. Eyes: Conjunctivae are normal.  ENT   Head: Normocephalic and atraumatic.   Nose: No congestion/rhinnorhea.   Mouth/Throat: Mucous membranes are moist.   Neck: No stridor. Hematological/Lymphatic/Immunilogical: No cervical lymphadenopathy. Cardiovascular: Normal rate, regular rhythm.  No murmurs, rubs, or gallops.  Respiratory: Normal respiratory effort without tachypnea nor retractions. Breath sounds are clear and equal bilaterally. No wheezes/rales/rhonchi. Gastrointestinal: Soft and non tender. No rebound. No guarding.  Genitourinary: Deferred Musculoskeletal: Normal range of motion in all extremities. No lower extremity edema. Neurologic:  Normal speech and language. No gross focal neurologic deficits are appreciated.  Skin:  Skin is warm, dry and intact. No rash noted. Psychiatric: Mood and affect are normal. Speech and behavior are normal. Patient exhibits appropriate insight and judgment.  ____________________________________________    LABS (pertinent positives/negatives)  Ethanol 280 CBC wbc 5.0, hgb 15.9 CMP glu 127 Trop <0.03 ____________________________________________   EKG  I, Phineas SemenGraydon Duayne Brideau, attending physician, personally viewed and interpreted this EKG  EKG Time: 2139 Rate: 71 Rhythm: normal sinus rhythm Axis: normal Intervals: qtc 436 QRS: narrow ST changes: no st elevation Impression: normal ekg   ____________________________________________    RADIOLOGY  None  ____________________________________________   PROCEDURES  Procedures  ____________________________________________   INITIAL IMPRESSION / ASSESSMENT AND PLAN / ED  COURSE  Pertinent labs & imaging results that were available during my care of the patient were reviewed by me and considered in my medical decision making (see chart for details).  Patient presented to the emergency department today with primary concern for alcohol detox.  I had a long discussion with the patient.  He did seem interested in both RTS and RHS.  He was interested in Librium taper.  I did discussion with the patient about the importance of alcohol cessation in terms of long-term health.  Patient's blood work did not show any concerning signs of alcoholic ketoacidosis or electrolyte abnormality.    ____________________________________________   FINAL CLINICAL IMPRESSION(S) / ED DIAGNOSES  Final diagnoses:  Alcohol abuse     Note: This dictation was prepared with Dragon dictation. Any transcriptional errors that result from this process are unintentional     Phineas SemenGoodman, Merline Perkin, MD 09/11/17 [phone]

## 2017-09-11 NOTE — ED Triage Notes (Signed)
Pt arrived to the ED via EMS for complaints of dizziness, headache and wanting to detox from alcohol. Pt reports that he was sitting at home and felt that he could hear his heart beat in his head and that made him dizzy. Pt reports that he is an alcoholic and believes that his "high blood pressure and his dependency on alcohol is making him feel this way." Pt is AOx4 and appears to be intoxicated. Pt states [facility] and that it did not help, he states that he needs a 30 day program.

## 2017-09-11 NOTE — ED Notes (Signed)
ED Provider at bedside. 

## 2017-09-11 NOTE — Discharge Instructions (Signed)
Please seek medical attention and help for any thoughts about wanting to harm yourself, harm others, any concerning change in behavior, severe depression, inappropriate drug use or any other new or concerning symptoms. ° °

## 2017-11-01 ENCOUNTER — Other Ambulatory Visit: Payer: Self-pay

## 2017-11-06 NOTE — Progress Notes (Signed)
11/07/2017 2:09 PM   Oscar Poole January 30, 1961 [id number]  Referring provider: [name], MD [address] [place], [place] [address]  No chief complaint on file.   HPI: Patient is a 57 year old Caucasian male with a history of polysubstance abuse and alcoholism who presents today as a referral from [name], [name] for a knot in the penis.  He states he discovered a "knot" in his penis months ago during an episode of masturbation.  He denied any known trauma to the genital area.  He states that now he is experiencing pain with his erections and a mild curvature.  He states his curvature has not yet reached 30 degrees.   He has not noted anything that has relieved the not.  He has not noted anything that makes the not worse.  He is having painful urination, nocturia x2, intermittency, hesitancy and straining to urinate and a weak urinary stream that is been occurring for a while.  Patient denies any gross hematuria, dysuria or suprapubic/flank pain.  Patient denies any fevers, chills, nausea or vomiting.   He also is experiencing erectile dysfunction.   PMH: Past Medical History:  Diagnosis Date  . Alcoholic (HCC)   . Back pain   . Hepatitis C     Surgical History: Past Surgical History:  Procedure Laterality Date  . HERNIA REPAIR      Home Medications:  Allergies as of 11/07/2017   No Known Allergies     Medication List        Accurate as of 11/07/17  2:09 PM. Always use your most recent med list.          lisinopril 10 MG tablet Commonly known as:  PRINIVIL,ZESTRIL Take 10 mg by mouth daily.   pantoprazole 40 MG tablet Commonly known as:  PROTONIX Take 1 tablet (40 mg total) by mouth daily.   pentoxifylline 400 MG CR tablet Commonly known as:  TRENTAL Take three tablets daily   sildenafil 20 MG tablet Commonly known as:  REVATIO Take 3 to 5 tablets two hours before intercouse on an empty stomach.  Do not take with nitrates.       Allergies:  No Known Allergies  Family History: Family History  Problem Relation Age of Onset  . Emphysema Mother 54  . Heart attack Father 18  . Heart attack Brother 54  . Prostate cancer Neg Hx   . Kidney cancer Neg Hx   . Bladder Cancer Neg Hx     Social History:  reports that  has never smoked. he has never used smokeless tobacco. He reports that he drinks alcohol. He reports that he uses drugs.  ROS: UROLOGY Frequent Urination?: No Hard to postpone urination?: No Burning/pain with urination?: Yes Get up at night to urinate?: Yes Leakage of urine?: No Urine stream starts and stops?: Yes Trouble starting stream?: Yes Do you have to strain to urinate?: Yes Blood in urine?: No Urinary tract infection?: No Sexually transmitted disease?: No Injury to kidneys or bladder?: No Painful intercourse?: No Weak stream?: Yes Erection problems?: Yes Penile pain?: Yes  Gastrointestinal Nausea?: No Vomiting?: No Indigestion/heartburn?: No Diarrhea?: No Constipation?: No  Constitutional Fever: No Night sweats?: Yes Weight loss?: No Fatigue?: No  Skin Skin rash/lesions?: Yes Itching?: No  Eyes Blurred vision?: No Double vision?: No  Ears/Nose/Throat Sore throat?: No Sinus problems?: No  Hematologic/Lymphatic Swollen glands?: Yes Easy bruising?: Yes  Cardiovascular Leg swelling?: No Chest pain?: No  Respiratory Cough?: No Shortness  of breath?: Yes  Endocrine Excessive thirst?: Yes  Musculoskeletal Back pain?: Yes Joint pain?: Yes  Neurological Headaches?: No Dizziness?: Yes  Psychologic Depression?: No Anxiety?: No  Physical Exam: BP (!) 141/91   Pulse 90   Resp 16   Ht 5\' 9"  (1.753 m)   Wt 263 lb 3.2 oz (119.4 kg)   SpO2 98%   BMI 38.87 kg/m   Constitutional: Well nourished. Alert and oriented, No acute distress. HEENT: [place] AT, moist mucus membranes. Trachea midline, no masses. Cardiovascular: No clubbing, cyanosis, or edema. Respiratory: Normal  respiratory effort, no increased work of breathing. GI: Abdomen is soft, non tender, non distended, no abdominal masses. Liver and spleen not palpable.  No hernias appreciated.  Stool sample for occult testing is not indicated.   GU: No CVA tenderness.  No bladder fullness or masses.  Patient with uncircumcised phallus.  Foreskin easily retracted   Urethral meatus is patent.  No penile discharge. No penile lesions or rashes. Peyronie's plaque located mid shaft 10 mm x 5 mm.  Scrotum without lesions, cysts, rashes and/or edema.  Testicles are located scrotally bilaterally. No masses are appreciated in the testicles. Left and right epididymis are normal. Rectal: Patient with  normal sphincter tone. Anus and perineum without scarring or rashes. No rectal masses are appreciated. Prostate is approximately 35 grams, no nodules are appreciated. Seminal vesicles are normal. Skin: No rashes, bruises or suspicious lesions. Lymph: No cervical or inguinal adenopathy. Neurologic: Grossly intact, no focal deficits, moving all 4 extremities. Psychiatric: Normal mood and affect.  Laboratory Data: PSA History  0.88 in 09/2017 Lab Results  Component Value Date   WBC 5.0 09/11/2017   HGB 15.9 09/11/2017   HCT 46.3 09/11/2017   MCV 100.2 (H) 09/11/2017   PLT 164 09/11/2017    Lab Results  Component Value Date   CREATININE 1.06 09/11/2017    No results found for: PSA  No results found for: TESTOSTERONE  No results found for: HGBA1C  Lab Results  Component Value Date   TSH 3.51 01/25/2012    No results found for: CHOL, HDL, CHOLHDL, VLDL, LDLCALC  Lab Results  Component Value Date   AST 116 (H) 09/11/2017   Lab Results  Component Value Date   ALT 96 (H) 09/11/2017   No components found for: ALKALINEPHOPHATASE No components found for: BILIRUBINTOTAL  No results found for: ESTRADIOL  Urinalysis    Component Value Date/Time   COLORURINE YELLOW (A) 02/23/2017 2246   APPEARANCEUR CLEAR  (A) 02/23/2017 2246   LABSPEC 1.011 02/23/2017 2246   PHURINE 6.0 02/23/2017 2246   GLUCOSEU NEGATIVE 02/23/2017 2246   HGBUR NEGATIVE 02/23/2017 2246   BILIRUBINUR NEGATIVE 02/23/2017 2246   KETONESUR NEGATIVE 02/23/2017 2246   PROTEINUR NEGATIVE 02/23/2017 2246   NITRITE NEGATIVE 02/23/2017 2246   LEUKOCYTESUR NEGATIVE 02/23/2017 2246    I have reviewed the labs.   Assessment & Plan:    1. Peyronie's disease Start Trental 400 mg tid Recheck in 6 weeks  2. Erectile dysfunction  - I explained to the patient that in order to achieve an erection it takes good functioning of the nervous system (parasympathetic and rs, sympathetic, sensory and motor), good blood flow into the erectile tissue of the penis and a desire to have sex  - I explained that conditions like diabetes, hypertension, coronary artery disease, peripheral vascular disease, smoking, alcohol consumption, age, sleep apnea and BPH can diminish the ability to have an erection  - A recent study  published in Sex Med 2018 Apr 13 revealed moderate to vigorous aerobic exercise for 40 minutes 4 times per week can decrease erectile problems caused by physical inactivity, obesity, hypertension, metabolic syndrome and/or cardiovascular diseases  - We discussed trying a PDE5 inhibitor   - He would like to try sildenafil Sildenafil 20 mg, 3 to 5 tablets two hours prior to intercourse on an empty stomach, # 50; he is warned not to take medications that contain nitrates.  I also advised him of the side effects, such as: headache, flushing, dyspepsia, abnormal vision, nasal congestion, back pain, myalgia, nausea, dizziness, and rash.  - RTC in 6 weeks for repeat SHIM score and exam    Return in about 6 weeks (around 12/19/2017) for SHIM and exam.  These notes generated with voice recognition software. I apologize for typographical errors.  Michiel Cowboy, PA-C  [facility] [address] [place], [place] [address] [phone]

## 2017-11-07 ENCOUNTER — Ambulatory Visit (INDEPENDENT_AMBULATORY_CARE_PROVIDER_SITE_OTHER): Payer: Medicaid Other | Admitting: Urology

## 2017-11-07 ENCOUNTER — Encounter: Payer: Self-pay | Admitting: Urology

## 2017-11-07 VITALS — BP 141/91 | HR 90 | Resp 16 | Ht 69.0 in | Wt 263.2 lb

## 2017-11-07 DIAGNOSIS — N529 Male erectile dysfunction, unspecified: Secondary | ICD-10-CM | POA: Diagnosis not present

## 2017-11-07 DIAGNOSIS — N486 Induration penis plastica: Secondary | ICD-10-CM | POA: Diagnosis not present

## 2017-11-07 MED ORDER — PENTOXIFYLLINE ER 400 MG PO TBCR: EXTENDED_RELEASE_TABLET | ORAL | 3 refills | Status: DC

## 2017-11-07 MED ORDER — SILDENAFIL CITRATE 20 MG PO TABS: ORAL_TABLET | ORAL | 3 refills | Status: DC

## 2017-11-07 NOTE — Patient Instructions (Signed)
Sildenafil 20 mg, 3 to 5 tablets two hours prior to intercourse on an empty stomach, # 50; he is warned not to take medications that contain nitrates.  I also advised him of the side effects, such as: headache, flushing, dyspepsia, abnormal vision, nasal congestion, back pain, myalgia, nausea, dizziness, and rash.

## 2017-12-18 ENCOUNTER — Ambulatory Visit: Payer: Medicaid Other | Admitting: Urology

## 2018-01-21 NOTE — Progress Notes (Deleted)
01/22/2018 3:40 PM   Oscar Poole August 31, 1960 [id number]  Referring provider: [name], MD [address] [place], [place] [address]  No chief complaint on file.   HPI: Patient is a 57 year old Caucasian male with Peyronie's disease and erectile dysfunction who presents today for follow-up.  Background history Patient is a 57 year old Caucasian male with a history of polysubstance abuse and alcoholism who presents today as a referral from Oscar Nones, [name] for a knot in the penis.  He states he discovered a "knot" in his penis months ago during an episode of masturbation.  He denied any known trauma to the genital area.  He states that now he is experiencing pain with his erections and a mild curvature.  He states his curvature has not yet reached 30 degrees.  He has not noted anything that has relieved the not.  He has not noted anything that makes the not worse.  He is having painful urination, nocturia x2, intermittency, hesitancy and straining to urinate and a weak urinary stream that is been occurring for a while.  Patient denies any gross hematuria, dysuria or suprapubic/flank pain.  Patient denies any fevers, chills, nausea or vomiting.   He also is experiencing erectile dysfunction.  He has been taking the Trentil tid.  ***  Erectile dysfunction His SHIM score is ***, which is ***.   His previous SHIM score was ***.  He has been having difficulty with erections for ***.   His major complaint is ***.  His libido is ***.   His risk factors for ED are age, BPH and alcohol abuse.  He denies any painful erections or curvatures with his erections.   He is still having/no longer having spontaneous erections.  He found the sildenafil ***     Score: 1-7 Severe ED 8-11 Moderate ED 12-16 Mild-Moderate ED 17-21 Mild ED 22-25 No ED       PMH: Past Medical History:  Diagnosis Date  . Alcoholic (HCC)   . Back pain   . Hepatitis C     Surgical History: Past Surgical  History:  Procedure Laterality Date  . HERNIA REPAIR      Home Medications:  Allergies as of 01/22/2018   No Known Allergies     Medication List        Accurate as of 01/21/18  3:40 PM. Always use your most recent med list.          lisinopril 10 MG tablet Commonly known as:  PRINIVIL,ZESTRIL Take 10 mg by mouth daily.   pantoprazole 40 MG tablet Commonly known as:  PROTONIX Take 1 tablet (40 mg total) by mouth daily.   pentoxifylline 400 MG CR tablet Commonly known as:  TRENTAL Take three tablets daily   sildenafil 20 MG tablet Commonly known as:  REVATIO Take 3 to 5 tablets two hours before intercouse on an empty stomach.  Do not take with nitrates.       Allergies: No Known Allergies  Family History: Family History  Problem Relation Age of Onset  . Emphysema Mother 54  . Heart attack Father 99  . Heart attack Brother 54  . Prostate cancer Neg Hx   . Kidney cancer Neg Hx   . Bladder Cancer Neg Hx     Social History:  reports that he has never smoked. He has never used smokeless tobacco. He reports that he drinks alcohol. He reports that he has current or past drug history.  ROS:  Physical Exam: There were no vitals taken for this visit.  Constitutional: Well nourished. Alert and oriented, No acute distress. HEENT: [place] AT, moist mucus membranes. Trachea midline, no masses. Cardiovascular: No clubbing, cyanosis, or edema. Respiratory: Normal respiratory effort, no increased work of breathing. GI: Abdomen is soft, non tender, non distended, no abdominal masses. Liver and spleen not palpable.  No hernias appreciated.  Stool sample for occult testing is not indicated.   GU: No CVA tenderness.  No bladder fullness or masses.  Patient with uncircumcised phallus.  Foreskin easily retracted   Urethral meatus is patent.  No penile discharge. No penile lesions or rashes. Peyronie's plaque located mid shaft  10 mm x 5 mm.  Scrotum without lesions, cysts, rashes and/or edema.  Testicles are located scrotally bilaterally. No masses are appreciated in the testicles. Left and right epididymis are normal. Rectal: Patient with  normal sphincter tone. Anus and perineum without scarring or rashes. No rectal masses are appreciated. Prostate is approximately 35 grams, no nodules are appreciated. Seminal vesicles are normal. Skin: No rashes, bruises or suspicious lesions. Lymph: No cervical or inguinal adenopathy. Neurologic: Grossly intact, no focal deficits, moving all 4 extremities. Psychiatric: Normal mood and affect. ***  Constitutional: Well nourished. Alert and oriented, No acute distress. HEENT: [place] AT, moist mucus membranes. Trachea midline, no masses. Cardiovascular: No clubbing, cyanosis, or edema. Respiratory: Normal respiratory effort, no increased work of breathing. GI: Abdomen is soft, non tender, non distended, no abdominal masses. Liver and spleen not palpable.  No hernias appreciated.  Stool sample for occult testing is not indicated.   GU: No CVA tenderness.  No bladder fullness or masses.  Patient with circumcised/uncircumcised phallus. ***Foreskin easily retracted***  Urethral meatus is patent.  No penile discharge. No penile lesions or rashes. Scrotum without lesions, cysts, rashes and/or edema.  Testicles are located scrotally bilaterally. No masses are appreciated in the testicles. Left and right epididymis are normal. Rectal: Patient with  normal sphincter tone. Anus and perineum without scarring or rashes. No rectal masses are appreciated. Prostate is approximately *** grams, *** nodules are appreciated. Seminal vesicles are normal. Skin: No rashes, bruises or suspicious lesions. Lymph: No cervical or inguinal adenopathy. Neurologic: Grossly intact, no focal deficits, moving all 4 extremities. Psychiatric: Normal mood and affect.   Laboratory Data: PSA History  0.88 in 09/2017 Lab  Results  Component Value Date   WBC 5.0 09/11/2017   HGB 15.9 09/11/2017   HCT 46.3 09/11/2017   MCV 100.2 (H) 09/11/2017   PLT 164 09/11/2017    Lab Results  Component Value Date   CREATININE 1.06 09/11/2017    No results found for: PSA  No results found for: TESTOSTERONE  No results found for: HGBA1C  Lab Results  Component Value Date   TSH 3.51 01/25/2012    No results found for: CHOL, HDL, CHOLHDL, VLDL, LDLCALC  Lab Results  Component Value Date   AST 116 (H) 09/11/2017   Lab Results  Component Value Date   ALT 96 (H) 09/11/2017   No components found for: ALKALINEPHOPHATASE No components found for: BILIRUBINTOTAL  No results found for: ESTRADIOL  Urinalysis    Component Value Date/Time   COLORURINE YELLOW (A) 02/23/2017 2246   APPEARANCEUR CLEAR (A) 02/23/2017 2246   LABSPEC 1.011 02/23/2017 2246   PHURINE 6.0 02/23/2017 2246   GLUCOSEU NEGATIVE 02/23/2017 2246   HGBUR NEGATIVE 02/23/2017 2246   BILIRUBINUR NEGATIVE 02/23/2017 2246   KETONESUR NEGATIVE 02/23/2017 2246   PROTEINUR  NEGATIVE 02/23/2017 2246   NITRITE NEGATIVE 02/23/2017 2246   LEUKOCYTESUR NEGATIVE 02/23/2017 2246    I have reviewed the labs.   Assessment & Plan:    1. Peyronie's disease Start Trental 400 mg tid Recheck in 6 weeks  2. Erectile dysfunction SHIM score is ***  - I explained to the patient that in order to achieve an erection it takes good functioning of the nervous system (parasympathetic and rs, sympathetic, sensory and motor), good blood flow into the erectile tissue of the penis and a desire to have sex  - I explained that conditions like diabetes, hypertension, coronary artery disease, peripheral vascular disease, smoking, alcohol consumption, age, sleep apnea and BPH can diminish the ability to have an erection  - A recent study published in Sex Med 2018 Apr 13 revealed moderate to vigorous aerobic exercise for 40 minutes 4 times per week can decrease erectile  problems caused by physical inactivity, obesity, hypertension, metabolic syndrome and/or cardiovascular diseases  - We discussed trying a PDE5 inhibitor   - He would like to try sildenafil Sildenafil 20 mg, 3 to 5 tablets two hours prior to intercourse on an empty stomach, # 50; he is warned not to take medications that contain nitrates.  I also advised him of the side effects, such as: headache, flushing, dyspepsia, abnormal vision, nasal congestion, back pain, myalgia, nausea, dizziness, and rash.  - RTC in 6 weeks for repeat SHIM score and exam    No follow-ups on file.  These notes generated with voice recognition software. I apologize for typographical errors.  Michiel Cowboy, PA-C  [facility] [address] [place], [place] [address] [phone]

## 2018-01-22 ENCOUNTER — Encounter: Payer: Self-pay | Admitting: Urology

## 2018-01-22 ENCOUNTER — Ambulatory Visit: Payer: Self-pay | Admitting: Urology

## 2021-02-11 ENCOUNTER — Emergency Department
Admission: EM | Admit: 2021-02-11 | Discharge: 2021-02-12 | Disposition: A | Discharge status: Home / Self Care | Payer: Medicaid Other | Attending: Emergency Medicine | Admitting: Emergency Medicine

## 2021-02-11 ENCOUNTER — Other Ambulatory Visit: Payer: Self-pay

## 2021-02-11 ENCOUNTER — Emergency Department: Payer: Medicaid Other

## 2021-02-11 ENCOUNTER — Encounter: Payer: Self-pay | Admitting: Emergency Medicine

## 2021-02-11 DIAGNOSIS — S9031XA Contusion of right foot, initial encounter: Secondary | ICD-10-CM | POA: Diagnosis not present

## 2021-02-11 DIAGNOSIS — S99921A Unspecified injury of right foot, initial encounter: Secondary | ICD-10-CM | POA: Diagnosis present

## 2021-02-11 DIAGNOSIS — Z79899 Other long term (current) drug therapy: Secondary | ICD-10-CM | POA: Insufficient documentation

## 2021-02-11 DIAGNOSIS — W208XXA Other cause of strike by thrown, projected or falling object, initial encounter: Secondary | ICD-10-CM | POA: Diagnosis not present

## 2021-02-11 MED ORDER — ACETAMINOPHEN 500 MG PO TABS
1000.0000 mg | ORAL_TABLET | Freq: Once | ORAL | Status: AC
  Administered 2021-02-12: 1000 mg via ORAL
  Filled 2021-02-11: qty 2

## 2021-02-11 MED ORDER — KETOROLAC TROMETHAMINE 30 MG/ML IJ SOLN
15.0000 mg | Freq: Once | INTRAMUSCULAR | Status: AC
  Administered 2021-02-12: 15 mg via INTRAVENOUS
  Filled 2021-02-11: qty 1

## 2021-02-11 NOTE — ED Triage Notes (Signed)
Patient arrives via [facility] from home for right foot swelling. Patient dropped a large piece of wood on foot two days ago and now has swelling up to calf.

## 2021-02-11 NOTE — ED Provider Notes (Signed)
[facility] Emergency Department Provider Note  ____________________________________________   Event Date/Time   First MD Initiated Contact with Patient 02/11/21 2303     (approximate)  I have reviewed the triage vital signs and the nursing notes.   HISTORY  Chief Complaint Foot Swelling    HPI Oscar Poole is a 60 y.o. male who presents for evaluation of pain in his right foot.   Marland Kitchen  He states that 2 days ago he dropped a large piece of wood on the top of the right foot near the base of the toes.  He has had pain, bruising, and swelling throughout the foot and is painful to walk on it although he is able to do so.  He has chronic swelling in both legs and says he thinks he has a lot of fluid in his legs.  It is difficult to appreciate if the swelling in his legs has gotten worse recently.  He is ambulatory and active and has no history of blood clots in the legs of the lungs.  The pain is worse when he bears weight and moves around or pushes on the top of his foot, better at rest.  He describes the pain as severe.    He said that in addition to his long-term fluid retention in his legs, he has been urinating more often than recently and that he has burning pain when he urinates.    Patient denies chest pain, back pain, abdominal pain, nausea, vomiting, fever.  He has had no shortness of breath.     Past Medical History:  Diagnosis Date   Alcoholic (HCC)    Back pain    Hepatitis C     Patient Active Problem List   Diagnosis Date Noted   Opiate overdose (HCC) 11/14/2015   Alcohol intoxication (HCC) 11/14/2015   Hypokalemia 11/14/2015   AKI (acute kidney injury) (HCC) 11/14/2015   Overdose 11/14/2015    Past Surgical History:  Procedure Laterality Date   HERNIA REPAIR      Prior to Admission medications   Medication Sig Start Date End Date Taking? Authorizing Provider  lisinopril (PRINIVIL,ZESTRIL) 10 MG tablet Take 10 mg by mouth daily.  10/17/17   [provider]  pantoprazole (PROTONIX) 40 MG tablet Take 1 tablet (40 mg total) by mouth daily. 02/23/17 02/23/18  Emily Filbert, MD  pentoxifylline (TRENTAL) 400 MG CR tablet Take three tablets daily 11/07/17   Michiel Cowboy A, PA-C  sildenafil (REVATIO) 20 MG tablet Take 3 to 5 tablets two hours before intercouse on an empty stomach.  Do not take with nitrates. 11/07/17   Michiel Cowboy A, PA-C    Allergies Patient has no known allergies.  Family History  Problem Relation Age of Onset   Emphysema Mother 19   Heart attack Father 18   Heart attack Brother 90   Prostate cancer Neg Hx    Kidney cancer Neg Hx    Bladder Cancer Neg Hx     Social History Social History   Tobacco Use   Smoking status: Never   Smokeless tobacco: Never  Substance Use Topics   Alcohol use: Yes    Alcohol/week: 0.0 standard drinks    Comment: 6 10% alcohol beers per day   Drug use: Yes    Review of Systems Constitutional: No fever/chills Eyes: No visual changes. ENT: No sore throat. Cardiovascular: Denies chest pain. Respiratory: Denies shortness of breath. Gastrointestinal: No abdominal pain.  No nausea, no  vomiting.  No diarrhea.  No constipation. Genitourinary: Negative for dysuria. Musculoskeletal: Pain and swelling in right foot after crush injury. Integumentary: Negative for rash. Neurological: Negative for headaches, focal weakness or numbness.   ____________________________________________   PHYSICAL EXAM:  VITAL SIGNS: ED Triage Vitals  Enc Vitals Group     BP 02/11/21 2302 (!) 127/91     Pulse Rate 02/11/21 2302 91     Resp 02/11/21 2302 18     Temp 02/11/21 2302 98.4 F (36.9 C)     Temp Source 02/11/21 2302 Oral     SpO2 02/11/21 2257 94 %     Weight 02/11/21 2300 (!) 147.4 kg (325 lb)     Height 02/11/21 2300 1.829 m (6')     Head Circumference --      Peak Flow --      Pain Score 02/11/21 2259 10     Pain Loc --      Pain Edu? --       Excl. in GC? --     Constitutional: Alert and oriented.  Eyes: Conjunctivae are normal.  Head: Atraumatic. Nose: No congestion/rhinnorhea. Mouth/Throat: Patient is wearing a mask. Neck: No stridor.  No meningeal signs.   Cardiovascular: Normal rate, regular rhythm. Good peripheral circulation. Respiratory: Normal respiratory effort.  No retractions. Gastrointestinal: Obese.  Soft and nontender. Musculoskeletal: Patient has what appears to be chronic peripheral edema/lymphedema with skin thickening and chronic skin changes consistent with chronic venous insufficiency.  There is no warmth or red streaking to suggest cellulitis even though there is a degree of erythema which I believe is chronic.  He has some obvious subacute bruising to the top of his right foot at the base of the toes and the area that he explained was struck by the following wood.  He has some chronic skin changes on his foot as well including some tinea but there are no lacerations as result of the injury and there are no wounds that appear consistent with a diabetic foot ulcer. Neurologic:  Normal speech and language. No gross focal neurologic deficits are appreciated.  Skin:  Skin is warm, dry and intact. Psychiatric: Mood and affect are normal. Speech and behavior are normal.  ____________________________________________   LABS (all labs ordered are listed, but only abnormal results are displayed)  Labs Reviewed  URINALYSIS, ROUTINE W REFLEX MICROSCOPIC - Abnormal; Notable for the following components:      Result Value   Color, Urine YELLOW (*)    APPearance CLEAR (*)    Specific Gravity, Urine 1.003 (*)    All other components within normal limits   ____________________________________________  EKG  No indication for emergent EKG ____________________________________________  RADIOLOGY Marylou Mccoy, personally viewed and evaluated these images (plain radiographs) as part of my medical decision making, as  well as reviewing the written report by the radiologist.  ED MD interpretation:  No acute fractures  Official radiology report(s): DG Foot Complete Right  Result Date: 02/12/2021 CLINICAL DATA:  Pain and swelling after crush injury. Dropped a piece of wood on foot 2 days ago with swelling. EXAM: RIGHT FOOT COMPLETE - 3+ VIEW COMPARISON:  None. FINDINGS: There is no evidence of fracture or dislocation. Hammertoe deformity of the digits. Osteoarthritis of the first metatarsal phalangeal joint. Mild midfoot degenerative spurring. Probable spurring of the anterior calcaneus. Enthesopathic change at the base of the fifth metatarsal. Plantar calcaneal spur. Prominent soft tissue edema overlies the dorsum of foot, as well as the included  lower leg IMPRESSION: 1. No acute fracture or subluxation of the right foot. 2. Multifocal osteoarthritis. 3. Diffuse soft tissue edema. Electronically Signed   By: Narda Rutherford M.D.   On: 02/12/2021 00:01    ____________________________________________   PROCEDURES   Procedure(s) performed (including Critical Care):  Procedures   ____________________________________________   INITIAL IMPRESSION / MDM / ASSESSMENT AND PLAN / ED COURSE  As part of my medical decision making, I reviewed the following data within the electronic MEDICAL RECORD NUMBER Nursing notes reviewed and incorporated, Labs reviewed , Old chart reviewed, Radiograph reviewed , Notes from prior ED visits, and [place] Controlled Substance Database   Differential diagnosis includes, but is not limited to, crush injury with fractures and soft tissue swelling, compartment syndrome, cellulitis.  Patient's chronic conditions include venous insufficiency, peripheral edema, etc., but these do not seem to be related to his complaint tonight.  The patient confirmed to me that his legs are always swollen and that he had an appointment last month with his PCP and he talked about this issue at that time.  I  explained to him that the issue of chronic venous insufficiency and peripheral edema is best addressed by his primary care provider rather than me starting him on medications after only a brief visit in the emergency department explained that we would focus on his acute injury to his foot.  We are obtaining x-rays.  Fortunately his compartments are soft and easily compressible and there is no indication of compartment syndrome at this time.  He is not reporting any neurological symptoms although he reports severe pain he is ambulatory even though it is painful to do so.  I reviewed his medical record and the [place] controlled substance database and see that he has well-documented opioid abuse syndrome and is on Suboxone.  Given that he takes Suboxone and in fact filled his last prescription yesterday, providing additional oral or IV narcotics would be ill advised and would not be particularly effective.  As an alternative I am giving him Toradol 15 mg IV and 1000 mg of Tylenol while we await the results of his imaging.       Clinical Course as of 02/12/21 0147  Fri Feb 12, 2021  0144 Normal UA.  I personally reviewed the radiographs and agree with the radiologist's report that there are no acute fractures.  Discussed with patient, will d/c with hard-soled shoe, recommendations for cold packs, and use of home Suboxone. Patient understands. [CF]    Clinical Course User Index [CF] Loleta Rose, MD     ____________________________________________  FINAL CLINICAL IMPRESSION(S) / ED DIAGNOSES  Final diagnoses:  Contusion of right foot, initial encounter     MEDICATIONS GIVEN DURING THIS VISIT:  Medications  acetaminophen (TYLENOL) tablet 1,000 mg (1,000 mg Oral Given 02/12/21 0054)  ketorolac (TORADOL) 30 MG/ML injection 15 mg (15 mg Intravenous Given 02/12/21 0054)     ED Discharge Orders     None        Note:  This document was prepared using Dragon voice recognition  software and may include unintentional dictation errors.   Loleta Rose, MD 02/12/21 [phone]

## 2021-02-12 LAB — URINALYSIS, ROUTINE W REFLEX MICROSCOPIC
Bilirubin Urine: NEGATIVE
Glucose, UA: NEGATIVE mg/dL
Hgb urine dipstick: NEGATIVE
Ketones, ur: NEGATIVE mg/dL
Leukocytes,Ua: NEGATIVE
Nitrite: NEGATIVE
Protein, ur: NEGATIVE mg/dL
Specific Gravity, Urine: 1.003 — ABNORMAL LOW (ref 1.005–1.030)
pH: 6 (ref 5.0–8.0)

## 2021-02-12 NOTE — Discharge Instructions (Addendum)
Fortunately you have no broken bones in your foot. Please use the provided hard-soled shoe for comfort and try using cold packs. Use your regular prescribed pain medication; you can also take over-the-counter Tylenol as needed.  Follow up with your regular doctor at the next available opportunity.

## 2021-04-02 ENCOUNTER — Inpatient Hospital Stay
Admission: EM | Admit: 2021-04-02 | Discharge: 2021-04-14 | DRG: 432 | Disposition: A | Discharge status: Hospice | Payer: Medicaid Other | Attending: Internal Medicine | Admitting: Internal Medicine

## 2021-04-02 ENCOUNTER — Emergency Department: Payer: Medicaid Other

## 2021-04-02 ENCOUNTER — Other Ambulatory Visit: Payer: Self-pay

## 2021-04-02 DIAGNOSIS — K7469 Other cirrhosis of liver: Secondary | ICD-10-CM | POA: Diagnosis present

## 2021-04-02 DIAGNOSIS — D696 Thrombocytopenia, unspecified: Secondary | ICD-10-CM | POA: Diagnosis present

## 2021-04-02 DIAGNOSIS — E872 Acidosis: Secondary | ICD-10-CM | POA: Diagnosis present

## 2021-04-02 DIAGNOSIS — E871 Hypo-osmolality and hyponatremia: Secondary | ICD-10-CM | POA: Diagnosis present

## 2021-04-02 DIAGNOSIS — M549 Dorsalgia, unspecified: Secondary | ICD-10-CM | POA: Diagnosis present

## 2021-04-02 DIAGNOSIS — Z66 Do not resuscitate: Secondary | ICD-10-CM | POA: Diagnosis not present

## 2021-04-02 DIAGNOSIS — R109 Unspecified abdominal pain: Secondary | ICD-10-CM

## 2021-04-02 DIAGNOSIS — K4020 Bilateral inguinal hernia, without obstruction or gangrene, not specified as recurrent: Secondary | ICD-10-CM | POA: Diagnosis present

## 2021-04-02 DIAGNOSIS — Z7901 Long term (current) use of anticoagulants: Secondary | ICD-10-CM

## 2021-04-02 DIAGNOSIS — K219 Gastro-esophageal reflux disease without esophagitis: Secondary | ICD-10-CM | POA: Diagnosis present

## 2021-04-02 DIAGNOSIS — F10229 Alcohol dependence with intoxication, unspecified: Secondary | ICD-10-CM | POA: Diagnosis present

## 2021-04-02 DIAGNOSIS — N3000 Acute cystitis without hematuria: Secondary | ICD-10-CM | POA: Diagnosis present

## 2021-04-02 DIAGNOSIS — K7031 Alcoholic cirrhosis of liver with ascites: Secondary | ICD-10-CM | POA: Diagnosis present

## 2021-04-02 DIAGNOSIS — Z8619 Personal history of other infectious and parasitic diseases: Secondary | ICD-10-CM

## 2021-04-02 DIAGNOSIS — Z8249 Family history of ischemic heart disease and other diseases of the circulatory system: Secondary | ICD-10-CM

## 2021-04-02 DIAGNOSIS — Z79891 Long term (current) use of opiate analgesic: Secondary | ICD-10-CM

## 2021-04-02 DIAGNOSIS — N179 Acute kidney failure, unspecified: Secondary | ICD-10-CM | POA: Diagnosis present

## 2021-04-02 DIAGNOSIS — R042 Hemoptysis: Secondary | ICD-10-CM | POA: Diagnosis not present

## 2021-04-02 DIAGNOSIS — K2980 Duodenitis without bleeding: Secondary | ICD-10-CM | POA: Diagnosis present

## 2021-04-02 DIAGNOSIS — F1190 Opioid use, unspecified, uncomplicated: Secondary | ICD-10-CM | POA: Diagnosis present

## 2021-04-02 DIAGNOSIS — Y903 Blood alcohol level of 60-79 mg/100 ml: Secondary | ICD-10-CM | POA: Diagnosis present

## 2021-04-02 DIAGNOSIS — K766 Portal hypertension: Secondary | ICD-10-CM | POA: Diagnosis present

## 2021-04-02 DIAGNOSIS — K449 Diaphragmatic hernia without obstruction or gangrene: Secondary | ICD-10-CM | POA: Diagnosis present

## 2021-04-02 DIAGNOSIS — F1110 Opioid abuse, uncomplicated: Secondary | ICD-10-CM | POA: Diagnosis present

## 2021-04-02 DIAGNOSIS — K7290 Hepatic failure, unspecified without coma: Secondary | ICD-10-CM | POA: Diagnosis present

## 2021-04-02 DIAGNOSIS — K852 Alcohol induced acute pancreatitis without necrosis or infection: Secondary | ICD-10-CM

## 2021-04-02 DIAGNOSIS — Z6841 Body Mass Index (BMI) 40.0 and over, adult: Secondary | ICD-10-CM

## 2021-04-02 DIAGNOSIS — K7040 Alcoholic hepatic failure without coma: Principal | ICD-10-CM | POA: Diagnosis present

## 2021-04-02 DIAGNOSIS — Y92239 Unspecified place in hospital as the place of occurrence of the external cause: Secondary | ICD-10-CM | POA: Diagnosis not present

## 2021-04-02 DIAGNOSIS — Z515 Encounter for palliative care: Secondary | ICD-10-CM

## 2021-04-02 DIAGNOSIS — D72829 Elevated white blood cell count, unspecified: Secondary | ICD-10-CM | POA: Diagnosis present

## 2021-04-02 DIAGNOSIS — B182 Chronic viral hepatitis C: Secondary | ICD-10-CM | POA: Diagnosis present

## 2021-04-02 DIAGNOSIS — Z538 Procedure and treatment not carried out for other reasons: Secondary | ICD-10-CM | POA: Diagnosis present

## 2021-04-02 DIAGNOSIS — K279 Peptic ulcer, site unspecified, unspecified as acute or chronic, without hemorrhage or perforation: Secondary | ICD-10-CM | POA: Diagnosis present

## 2021-04-02 DIAGNOSIS — Z20822 Contact with and (suspected) exposure to covid-19: Secondary | ICD-10-CM | POA: Diagnosis present

## 2021-04-02 DIAGNOSIS — K7200 Acute and subacute hepatic failure without coma: Secondary | ICD-10-CM | POA: Diagnosis present

## 2021-04-02 DIAGNOSIS — K5900 Constipation, unspecified: Secondary | ICD-10-CM | POA: Diagnosis present

## 2021-04-02 DIAGNOSIS — F1010 Alcohol abuse, uncomplicated: Secondary | ICD-10-CM | POA: Diagnosis present

## 2021-04-02 DIAGNOSIS — R251 Tremor, unspecified: Secondary | ICD-10-CM | POA: Diagnosis present

## 2021-04-02 DIAGNOSIS — E66813 Obesity, class 3: Secondary | ICD-10-CM | POA: Diagnosis present

## 2021-04-02 DIAGNOSIS — K7011 Alcoholic hepatitis with ascites: Secondary | ICD-10-CM | POA: Diagnosis present

## 2021-04-02 DIAGNOSIS — E876 Hypokalemia: Secondary | ICD-10-CM | POA: Diagnosis present

## 2021-04-02 DIAGNOSIS — F419 Anxiety disorder, unspecified: Secondary | ICD-10-CM | POA: Diagnosis present

## 2021-04-02 DIAGNOSIS — R188 Other ascites: Secondary | ICD-10-CM

## 2021-04-02 DIAGNOSIS — F10929 Alcohol use, unspecified with intoxication, unspecified: Secondary | ICD-10-CM | POA: Diagnosis present

## 2021-04-02 DIAGNOSIS — K859 Acute pancreatitis without necrosis or infection, unspecified: Secondary | ICD-10-CM | POA: Diagnosis present

## 2021-04-02 DIAGNOSIS — T380X5A Adverse effect of glucocorticoids and synthetic analogues, initial encounter: Secondary | ICD-10-CM | POA: Diagnosis not present

## 2021-04-02 DIAGNOSIS — K7682 Hepatic encephalopathy: Secondary | ICD-10-CM | POA: Diagnosis present

## 2021-04-02 DIAGNOSIS — R791 Abnormal coagulation profile: Secondary | ICD-10-CM | POA: Diagnosis present

## 2021-04-02 DIAGNOSIS — Z79899 Other long term (current) drug therapy: Secondary | ICD-10-CM

## 2021-04-02 DIAGNOSIS — E8809 Other disorders of plasma-protein metabolism, not elsewhere classified: Secondary | ICD-10-CM | POA: Diagnosis present

## 2021-04-02 DIAGNOSIS — Z825 Family history of asthma and other chronic lower respiratory diseases: Secondary | ICD-10-CM

## 2021-04-02 DIAGNOSIS — I10 Essential (primary) hypertension: Secondary | ICD-10-CM | POA: Diagnosis present

## 2021-04-02 DIAGNOSIS — D731 Hypersplenism: Secondary | ICD-10-CM | POA: Diagnosis present

## 2021-04-02 DIAGNOSIS — I959 Hypotension, unspecified: Secondary | ICD-10-CM | POA: Diagnosis present

## 2021-04-02 HISTORY — DX: Unspecified viral hepatitis B without hepatic coma: B19.10

## 2021-04-02 LAB — URINALYSIS, COMPLETE (UACMP) WITH MICROSCOPIC
Glucose, UA: 50 mg/dL — AB
Ketones, ur: NEGATIVE mg/dL
Leukocytes,Ua: NEGATIVE
Nitrite: NEGATIVE
Protein, ur: NEGATIVE mg/dL
Specific Gravity, Urine: 1.012 (ref 1.005–1.030)
pH: 5 (ref 5.0–8.0)

## 2021-04-02 LAB — CBC
HCT: 41.4 % (ref 39.0–52.0)
Hemoglobin: 15.1 g/dL (ref 13.0–17.0)
MCH: 37.1 pg — ABNORMAL HIGH (ref 26.0–34.0)
MCHC: 36.5 g/dL — ABNORMAL HIGH (ref 30.0–36.0)
MCV: 101.7 fL — ABNORMAL HIGH (ref 80.0–100.0)
Platelets: 97 10*3/uL — ABNORMAL LOW (ref 150–400)
RBC: 4.07 MIL/uL — ABNORMAL LOW (ref 4.22–5.81)
RDW: 18.3 % — ABNORMAL HIGH (ref 11.5–15.5)
WBC: 5.2 10*3/uL (ref 4.0–10.5)
nRBC: 0 % (ref 0.0–0.2)

## 2021-04-02 LAB — COMPREHENSIVE METABOLIC PANEL
ALT: 64 U/L — ABNORMAL HIGH (ref 0–44)
AST: 147 U/L — ABNORMAL HIGH (ref 15–41)
Albumin: 2.1 g/dL — ABNORMAL LOW (ref 3.5–5.0)
Alkaline Phosphatase: 108 U/L (ref 38–126)
Anion gap: 17 — ABNORMAL HIGH (ref 5–15)
BUN: 42 mg/dL — ABNORMAL HIGH (ref 6–20)
CO2: 21 mmol/L — ABNORMAL LOW (ref 22–32)
Calcium: 8.4 mg/dL — ABNORMAL LOW (ref 8.9–10.3)
Chloride: 88 mmol/L — ABNORMAL LOW (ref 98–111)
Creatinine, Ser: UNDETERMINED mg/dL (ref 0.61–1.24)
Glucose, Bld: 132 mg/dL — ABNORMAL HIGH (ref 70–99)
Potassium: 2.8 mmol/L — ABNORMAL LOW (ref 3.5–5.1)
Sodium: 126 mmol/L — ABNORMAL LOW (ref 135–145)
Total Bilirubin: 22.2 mg/dL (ref 0.3–1.2)
Total Protein: 7.9 g/dL (ref 6.5–8.1)

## 2021-04-02 LAB — MAGNESIUM: Magnesium: 2.2 mg/dL (ref 1.7–2.4)

## 2021-04-02 LAB — URINE DRUG SCREEN, QUALITATIVE (ARMC ONLY)
Amphetamines, Ur Screen: NOT DETECTED
Barbiturates, Ur Screen: NOT DETECTED
Benzodiazepine, Ur Scrn: POSITIVE — AB
Cannabinoid 50 Ng, Ur ~~LOC~~: NOT DETECTED
Cocaine Metabolite,Ur ~~LOC~~: NOT DETECTED
MDMA (Ecstasy)Ur Screen: NOT DETECTED
Methadone Scn, Ur: NOT DETECTED
Opiate, Ur Screen: NOT DETECTED
Phencyclidine (PCP) Ur S: NOT DETECTED
Tricyclic, Ur Screen: NOT DETECTED

## 2021-04-02 LAB — ETHANOL: Alcohol, Ethyl (B): 70 mg/dL — ABNORMAL HIGH (ref <10)

## 2021-04-02 LAB — ACETAMINOPHEN LEVEL: Acetaminophen (Tylenol), Serum: <10 ug/mL — ABNORMAL LOW (ref 10–30)

## 2021-04-02 LAB — PROTIME-INR
INR: 2.6 — ABNORMAL HIGH (ref 0.8–1.2)
Prothrombin Time: 27.9 seconds — ABNORMAL HIGH (ref 11.4–15.2)

## 2021-04-02 LAB — LIPASE, BLOOD: Lipase: 342 U/L — ABNORMAL HIGH (ref 11–51)

## 2021-04-02 LAB — BRAIN NATRIURETIC PEPTIDE: B Natriuretic Peptide: 69.9 pg/mL (ref 0.0–100.0)

## 2021-04-02 LAB — AMMONIA: Ammonia: 50 umol/L — ABNORMAL HIGH (ref 9–35)

## 2021-04-02 LAB — BILIRUBIN, DIRECT: Bilirubin, Direct: 18.3 mg/dL — ABNORMAL HIGH (ref 0.0–0.2)

## 2021-04-02 MED ORDER — SODIUM CHLORIDE 0.9 % IV SOLN
1.0000 g | Freq: Once | INTRAVENOUS | Status: AC
  Administered 2021-04-02: 1 g via INTRAVENOUS
  Filled 2021-04-02: qty 10

## 2021-04-02 MED ORDER — PREDNISONE 20 MG PO TABS
40.0000 mg | ORAL_TABLET | ORAL | Status: AC
  Administered 2021-04-03: 40 mg via ORAL
  Filled 2021-04-02: qty 2

## 2021-04-02 MED ORDER — ONDANSETRON 4 MG PO TBDP
4.0000 mg | ORAL_TABLET | Freq: Once | ORAL | Status: AC | PRN
  Administered 2021-04-02: 4 mg via ORAL
  Filled 2021-04-02: qty 1

## 2021-04-02 MED ORDER — FENTANYL CITRATE (PF) 100 MCG/2ML IJ SOLN
50.0000 ug | Freq: Once | INTRAMUSCULAR | Status: AC
  Administered 2021-04-02: 50 ug via INTRAVENOUS
  Filled 2021-04-02: qty 2

## 2021-04-02 MED ORDER — POTASSIUM CHLORIDE CRYS ER 20 MEQ PO TBCR
40.0000 meq | EXTENDED_RELEASE_TABLET | Freq: Once | ORAL | Status: AC
  Administered 2021-04-02: 40 meq via ORAL
  Filled 2021-04-02: qty 2

## 2021-04-02 NOTE — ED Notes (Signed)
Critical lab reported to smith md. Total Bili

## 2021-04-02 NOTE — ED Triage Notes (Signed)
Pt states that he has been unable to urinate x5days (used family members cath and had dark urine yesterday), pitting edema BLE, shob, general abd pain, vomiting, constipation x5days, general jaundice (new)

## 2021-04-02 NOTE — ED Notes (Addendum)
Bladder scan 113. Extra dark red/blue in lab

## 2021-04-02 NOTE — ED Notes (Signed)
In and Out Cath 50 ml dark urine sent to lab. Post void residual 6 ml.

## 2021-04-02 NOTE — ED Provider Notes (Addendum)
[facility] Emergency Department Provider Note  ____________________________________________   Event Date/Time   First MD Initiated Contact with Patient 04/02/21 2134     (approximate)  I have reviewed the triage vital signs and the nursing notes.   HISTORY  Chief Complaint Abdominal Pain   HPI Oscar Poole is a 60 y.o. male with a past medical history of hep B, hep C, and alcohol abuse stating has been tapering his beers down the last couple days drinking 1 beer today and 3 or 4 yesterday who presents for assessment of 3 or 4 days of generalized abdominal pain associate with yellowing of his skin, decreased urine output, constipation and cough.  Patient denies any new injuries or falls, recent illicit drug use, burning with urination, vomiting, chest pain, shortness of breath or other clear sick symptoms.  Denies any known problems with his kidneys and states he was only able to get little urine out today after borrowing family members In-N-Out catheter.  This is never happened before.  No other acute concerns at this time.         Past Medical History:  Diagnosis Date   Alcoholic (HCC)    Back pain    Hepatitis B    Hepatitis C     Patient Active Problem List   Diagnosis Date Noted   Opiate overdose (HCC) 11/14/2015   Alcohol intoxication (HCC) 11/14/2015   Hypokalemia 11/14/2015   AKI (acute kidney injury) (HCC) 11/14/2015   Overdose 11/14/2015    Past Surgical History:  Procedure Laterality Date   EYE SURGERY     HERNIA REPAIR      Prior to Admission medications   Medication Sig Start Date End Date Taking? Authorizing Provider  amLODipine (NORVASC) 5 MG tablet Take 5 mg by mouth every morning. 03/10/21   [provider]  famotidine (PEPCID) 20 MG tablet Take 20 mg by mouth 2 (two) times daily. 12/25/20   [provider]  gabapentin (NEURONTIN) 300 MG capsule Take 900 mg by mouth 2 (two) times daily. 03/20/21   [provider]  hydrochlorothiazide (HYDRODIURIL) 25 MG tablet Take 25 mg by mouth every morning. 03/10/21   [provider]  lisinopril (ZESTRIL) 40 MG tablet Take 40 mg by mouth daily. 03/15/21   [provider]  PROAIR HFA 108 (90 Base) MCG/ACT inhaler Inhale 1-2 puffs into the lungs every 4 (four) hours as needed. 03/10/21   [provider]  SUBOXONE 8-2 MG FILM Place 1 Film under the tongue 3 (three) times daily. 03/19/21   [provider]    Allergies Patient has no known allergies.  Family History  Problem Relation Age of Onset   Emphysema Mother 8   Heart attack Father 80   Heart attack Brother 86   Prostate cancer Neg Hx    Kidney cancer Neg Hx    Bladder Cancer Neg Hx     Social History Social History   Tobacco Use   Smoking status: Never   Smokeless tobacco: Never  Substance Use Topics   Alcohol use: Yes    Alcohol/week: 0.0 standard drinks    Comment: 6 10% alcohol beers per day   Drug use: Not Currently    Review of Systems  Review of Systems  Constitutional:  Negative for chills and fever.  HENT:  Negative for sore throat.   Eyes:  Negative for pain.  Respiratory:  Positive for cough. Negative for stridor.   Cardiovascular:  Negative  for chest pain.  Gastrointestinal:  Positive for constipation and nausea. Negative for vomiting.  Genitourinary:  Positive for urgency.  Musculoskeletal:  Negative for myalgias.  Skin:  Positive for itching. Negative for rash.  Neurological:  Negative for seizures, loss of consciousness and headaches.  Psychiatric/Behavioral:  Negative for suicidal ideas.   All other systems reviewed and are negative.    ____________________________________________   PHYSICAL EXAM:  VITAL SIGNS: ED Triage Vitals  Enc Vitals Group     BP 04/02/21 2101 (!) 178/133     Pulse Rate 04/02/21 2101 88     Resp 04/02/21 2101 (!) 24     Temp 04/02/21 2101 97.8 F (36.6 C)     Temp Source 04/02/21 2101  Oral     SpO2 04/02/21 2100 96 %     Weight 04/02/21 2102 (!) 326 lb 4.5 oz (148 kg)     Height 04/02/21 2102 6' (1.829 m)     Head Circumference --      Peak Flow --      Pain Score 04/02/21 2102 10     Pain Loc --      Pain Edu? --      Excl. in [place]? --    Vitals:   04/02/21 2315 04/02/21 2330  BP:    Pulse: 84 82  Resp: 17 20  Temp:    SpO2: 95% (!) 89%   Physical Exam Vitals and nursing note reviewed.  Constitutional:      Appearance: He is well-developed. He is ill-appearing.  HENT:     Head: Normocephalic and atraumatic.     Right Ear: External ear normal.     Left Ear: External ear normal.     Nose: Nose normal.  Eyes:     Conjunctiva/sclera: Conjunctivae normal.  Cardiovascular:     Rate and Rhythm: Normal rate and regular rhythm.     Heart sounds: No murmur heard. Pulmonary:     Effort: Pulmonary effort is normal. No respiratory distress.     Breath sounds: Normal breath sounds.  Abdominal:     Palpations: Abdomen is soft. There is hepatomegaly.     Tenderness: There is generalized abdominal tenderness.  Musculoskeletal:     Cervical back: Neck supple.     Right lower leg: Edema present.     Left lower leg: Edema present.  Skin:    General: Skin is warm and dry.     Capillary Refill: Capillary refill takes less than 2 seconds.     Coloration: Skin is jaundiced.  Neurological:     Mental Status: He is alert and oriented to person, place, and time.  Psychiatric:        Mood and Affect: Mood normal.    Icteric.  Oriented x3. ____________________________________________   LABS (all labs ordered are listed, but only abnormal results are displayed)  Labs Reviewed  LIPASE, BLOOD - Abnormal; Notable for the following components:      Result Value   Lipase 342 (*)    All other components within normal limits  COMPREHENSIVE METABOLIC PANEL - Abnormal; Notable for the following components:   Sodium 126 (*)    Potassium 2.8 (*)    Chloride 88 (*)    CO2  21 (*)    Glucose, Bld 132 (*)    BUN 42 (*)    Calcium 8.4 (*)    Albumin 2.1 (*)    AST 147 (*)    ALT 64 (*)    Total  Bilirubin 22.2 (*)    Anion gap 17 (*)    All other components within normal limits  CBC - Abnormal; Notable for the following components:   RBC 4.07 (*)    MCV 101.7 (*)    MCH 37.1 (*)    MCHC 36.5 (*)    RDW 18.3 (*)    Platelets 97 (*)    All other components within normal limits  URINALYSIS, COMPLETE (UACMP) WITH MICROSCOPIC - Abnormal; Notable for the following components:   Color, Urine AMBER (*)    APPearance CLOUDY (*)    Glucose, UA 50 (*)    Hgb urine dipstick MODERATE (*)    Bilirubin Urine MODERATE (*)    Bacteria, UA MANY (*)    All other components within normal limits  AMMONIA - Abnormal; Notable for the following components:   Ammonia 50 (*)    All other components within normal limits  PROTIME-INR - Abnormal; Notable for the following components:   Prothrombin Time 27.9 (*)    INR 2.6 (*)    All other components within normal limits  URINE DRUG SCREEN, QUALITATIVE ([facility] ONLY) - Abnormal; Notable for the following components:   Benzodiazepine, Ur Scrn POSITIVE (*)    All other components within normal limits  ETHANOL - Abnormal; Notable for the following components:   Alcohol, Ethyl (B) 70 (*)    All other components within normal limits  ACETAMINOPHEN LEVEL - Abnormal; Notable for the following components:   Acetaminophen (Tylenol), Serum <10 (*)    All other components within normal limits  URINE CULTURE  BRAIN NATRIURETIC PEPTIDE  MAGNESIUM  BILIRUBIN, DIRECT  HIV ANTIBODY (ROUTINE TESTING W REFLEX)   ____________________________________________  EKG  ____________________________________________  RADIOLOGY  ED MD interpretation: Chest x-ray shows cardiomegaly and some pulm edema.  No effusion, focal consolidation, pneumothorax or any other clear acute intrathoracic process.  CT abdomen pelvis remarkable for some stranding  inflammatory tracing within the distal duodenum possibly related to pancreas.  Could be related to peptic ulcer disease versus pancreatitis.  There is also evidence of hepatic cirrhosis with portal hypertension.  Splenomegaly and portosystemic collaterals.  Small volume abdominal pelvic ascites.  There is trace left pleural effusion.  There is a little bit of thickening of the ascending colon as well possibly related to portal hypertension or colitis.  Bilateral inguinal hernias containing fat are noted and a hiatal hernia with small volume ascites.  No other clear acute abdominopelvic process.  Official radiology report(s): CT ABDOMEN PELVIS WO CONTRAST  Result Date: 04/02/2021 CLINICAL DATA:  Acute abdominal pain. Patient states he was unable to urinate for 5 days. Lower extremity edema. Vomiting and constipation. Jaundice. EXAM: CT ABDOMEN AND PELVIS WITHOUT CONTRAST TECHNIQUE: Multidetector CT imaging of the abdomen and pelvis was performed following the standard protocol without IV contrast. COMPARISON:  Remote abdominal CT 10/24/2010 FINDINGS: Lower chest: Trace left pleural effusion. Mild dependent atelectasis in both lower lobes. Heart is mildly enlarged. There is a hiatal hernia with herniation of fat and ascites through the diaphragmatic defect. Hepatobiliary: Diffusely decreased hepatic density typical of steatosis. The liver is enlarged spanning 20.7 cm cranial caudal. There is capsular nodularity. No obvious focal hepatic lesion on this unenhanced exam. Gallbladder is distended. No calcified gallstone. Common bile duct is not well-defined, however there is no biliary dilatation. Pancreas: Fatty atrophy of the pancreas. There is stranding adjacent to the duodenum and uncinate process of the pancreas, favored to be duodenal in origin rather than pancreatic.  No pancreatic ductal dilatation. Spleen: Splenomegaly with spleen spanning 18 x 7.7 x 14.6 cm (volume = 1100 cm^3). No obvious focal abnormality  on this unenhanced exam. Adrenals/Urinary Tract: No adrenal nodule. No hydronephrosis or renal calculi. Mild symmetric perinephric edema, nonspecific. No evidence of focal renal lesion. Urinary bladder is near completely empty. Stomach/Bowel: Bowel assessment is limited in the absence of enteric contrast and presence of ascites. Stomach mildly distended with fluid or ingested material. There is a small hiatal hernia. 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 There is fat stranding and inflammatory changes adjacent to the distal duodenum, for example series 2, image 51. remainder of the small bowel is decompressed. No obstruction. The appendix is not definitively visualized on the current exam. Mild low-density wall thickening of the ascending colon. Remainder of the colon is decompressed and not well assessed. Vascular/Lymphatic: Normal caliber abdominal aorta with mild atherosclerosis. Left upper quadrant collaterals with varices coursing into the left lateral abdomen. Recannulated umbilical vein. There is no portal venous or mesenteric gas. Multiple prominent gastrohepatic and retroperitoneal nodes are likely reactive. There are few prominent bilateral inguinal nodes. Reproductive: Prostate is unremarkable. Other: Small volume abdominopelvic ascites with free fluid in the upper quadrants, both pericolic gutters, and in the pelvis. No free air or focal fluid collection. Right inguinal hernia contains fat and small volume ascites. Small left inguinal hernia contains fat. There is a small umbilical hernia with recannulized umbilical vein. Mild generalize subcutaneous edema. Musculoskeletal: Diffuse degenerative change in the spine. Modic endplate changes at X51-Z00. No focal bone lesion. IMPRESSION: 1. Fat stranding and inflammatory changes adjacent to the distal duodenum and uncinate process of the pancreas, favored to be duodenal in origin rather than pancreatic. Peptic ulcer disease or duodenitis are considered. Recommend  correlation with pancreatic enzymes. 2. Hepatic cirrhosis with portal hypertension. Splenomegaly and portosystemic collaterals. Small volume abdominopelvic ascites. 3. Trace left pleural effusion. 4. Mild low-density wall thickening of the ascending colon, may be secondary to portal hypertension or colitis. 5. Bilateral inguinal hernias, right greater than left, containing fat and small volume ascites. Small umbilical hernia with recannulized umbilical vein. 6. Hiatal hernia as well as fat and small volume ascites extending through the diaphragmatic hiatus. Aortic Atherosclerosis (ICD10-I70.0). Electronically Signed   By: Keith Rake M.D.   On: 04/02/2021 23:14   DG Chest Portable 1 View  Result Date: 04/02/2021 CLINICAL DATA:  Cough. Patient reports he has been able to urinate for 5 days. Lower extremity edema. Shortness of breath. Abdominal pain. EXAM: PORTABLE CHEST 1 VIEW COMPARISON:  Radiograph 11/14/2015. FINDINGS: The heart is enlarged. There is vascular congestion. No focal airspace disease. No pleural fluid or pneumothorax. No acute osseous abnormalities are seen. IMPRESSION: Cardiomegaly with vascular congestion. Electronically Signed   By: Keith Rake M.D.   On: 04/02/2021 21:58    ____________________________________________   PROCEDURES  Procedure(s) performed (including Critical Care):  .Critical Care  Date/Time: 04/02/2021 11:49 PM Performed by: Lucrezia Starch, MD Authorized by: Lucrezia Starch, MD   Critical care provider statement:    Critical care time (minutes):  45   Critical care was necessary to treat or prevent imminent or life-threatening deterioration of the following conditions:  Hepatic failure   Critical care was time spent personally by me on the following activities:  Discussions with consultants, evaluation of patient's response to treatment, examination of patient, ordering and performing treatments and interventions, ordering and review of laboratory  studies, ordering and review of radiographic studies, pulse oximetry, re-evaluation of patient's  condition, obtaining history from patient or surrogate and review of old charts   ____________________________________________   INITIAL IMPRESSION / [place] / ED COURSE      Presents for assessment of several days of worsening abdominal pain and swelling that it with cough, decreased urine output and some constipation.  This in the setting of ongoing alcohol abuse.  On arrival he is slight tachypneic with otherwise stable vital signs on room air.  He is very jaundiced and icteric appearing.  His abdomen is mildly tender throughout and distended.  He has lower extremity edema.  He does appear volume overloaded.  With regard to patient's cough suspect this may be related to edema seen on chest x-ray.  While his BNP is not elevated, patient does appear clinically volume overloaded.  Overall patient appears quite jaundiced and concern for severely elevated bilirubin.  CMP remarkable for a bilirubin of 22.2.  Otherwise remarkable for an NA of 126, K of 2.8, chloride of 88, BUN of 40 and AST of 147 and ALT of 64.  Anion gap 17.  CBC shows no leukocytosis or acute anemia.  Patient is thrombocytopenic with 97 platelets.  Ammonia is elevated at 50.  INR is 2.6.  Serum ethanol 70.   UA with many bacteria and bilirubin as well as 21-50 WBCs.  Somewhat difficult to exclude a cystitis.  We will plan a urine culture and give a dose of Rocephin.  Lipase concerning for acute pancreatitis at 342 at greater than 3 times upper limit of normal.  Of note alk phos is nonelevated on CMP and not suggestive of acute bili obstruction i.e. stone in the biliary tract.  Suspect hepatic decompensation and acute pancreatitis likely from ongoing alcohol abuse.    CT abdomen pelvis remarkable for some stranding inflammatory tracing within the distal duodenum possibly related to pancreas.  Could be related to peptic ulcer  disease versus pancreatitis.  There is also evidence of hepatic cirrhosis with portal hypertension.  Splenomegaly and portosystemic collaterals.  Small volume abdominal pelvic ascites.  There is trace left pleural effusion.  There is a little bit of thickening of the ascending colon as well possibly related to portal hypertension or colitis.  Bilateral inguinal hernias containing fat are noted and a hiatal hernia with small volume ascites.  No other clear acute abdominopelvic process.  Discussed with on-call gastroenterologist Dr. Allen Norris recommended hospital admission with plan to see the patient tomorrow.Also recommended starting pt on 74m of prednisone.   I will plan to admit to medicine service for further evaluation and management.  Computed MELD-Na score unavailable. Necessary lab results were not found in the last year. Computed MELD score unavailable. Necessary lab results were not found in the last year.       ____________________________________________   FINAL CLINICAL IMPRESSION(S) / ED DIAGNOSES  Final diagnoses:  Bilirubinemia  Alcohol abuse  Hypokalemia  Alcohol-induced acute pancreatitis, unspecified complication status  INR (international normal ratio) abnormal  Acute cystitis without hematuria    Medications  predniSONE (DELTASONE) tablet 40 mg (has no administration in time range)  ondansetron (ZOFRAN-ODT) disintegrating tablet 4 mg (4 mg Oral Given 04/02/21 2119)  fentaNYL (SUBLIMAZE) injection 50 mcg (50 mcg Intravenous Given 04/02/21 2203)  potassium chloride SA (KLOR-CON) CR tablet 40 mEq (40 mEq Oral Given 04/02/21 2314)  cefTRIAXone (ROCEPHIN) 1 g in sodium chloride 0.9 % 100 mL IVPB (0 g Intravenous Stopped 04/02/21 2348)     ED Discharge Orders     None  Note:  This document was prepared using Dragon voice recognition software and may include unintentional dictation errors.    Lucrezia Starch, MD 04/02/21 2349    Lucrezia Starch,  MD 04/02/21 2350    Lucrezia Starch, MD 04/02/21 2351    Lucrezia Starch, MD 04/02/21 [id number]

## 2021-04-03 DIAGNOSIS — Z66 Do not resuscitate: Secondary | ICD-10-CM | POA: Diagnosis not present

## 2021-04-03 DIAGNOSIS — E876 Hypokalemia: Secondary | ICD-10-CM | POA: Diagnosis present

## 2021-04-03 DIAGNOSIS — F10929 Alcohol use, unspecified with intoxication, unspecified: Secondary | ICD-10-CM | POA: Diagnosis not present

## 2021-04-03 DIAGNOSIS — F10229 Alcohol dependence with intoxication, unspecified: Secondary | ICD-10-CM | POA: Diagnosis present

## 2021-04-03 DIAGNOSIS — N179 Acute kidney failure, unspecified: Secondary | ICD-10-CM | POA: Diagnosis present

## 2021-04-03 DIAGNOSIS — R042 Hemoptysis: Secondary | ICD-10-CM | POA: Diagnosis present

## 2021-04-03 DIAGNOSIS — F1010 Alcohol abuse, uncomplicated: Secondary | ICD-10-CM | POA: Diagnosis present

## 2021-04-03 DIAGNOSIS — Z7189 Other specified counseling: Secondary | ICD-10-CM | POA: Diagnosis not present

## 2021-04-03 DIAGNOSIS — Z79899 Other long term (current) drug therapy: Secondary | ICD-10-CM | POA: Diagnosis not present

## 2021-04-03 DIAGNOSIS — K7290 Hepatic failure, unspecified without coma: Secondary | ICD-10-CM | POA: Diagnosis not present

## 2021-04-03 DIAGNOSIS — Y903 Blood alcohol level of 60-79 mg/100 ml: Secondary | ICD-10-CM | POA: Diagnosis present

## 2021-04-03 DIAGNOSIS — E871 Hypo-osmolality and hyponatremia: Secondary | ICD-10-CM | POA: Diagnosis present

## 2021-04-03 DIAGNOSIS — D696 Thrombocytopenia, unspecified: Secondary | ICD-10-CM | POA: Diagnosis present

## 2021-04-03 DIAGNOSIS — K7201 Acute and subacute hepatic failure with coma: Secondary | ICD-10-CM | POA: Diagnosis not present

## 2021-04-03 DIAGNOSIS — K5900 Constipation, unspecified: Secondary | ICD-10-CM | POA: Diagnosis not present

## 2021-04-03 DIAGNOSIS — K279 Peptic ulcer, site unspecified, unspecified as acute or chronic, without hemorrhage or perforation: Secondary | ICD-10-CM | POA: Diagnosis present

## 2021-04-03 DIAGNOSIS — I10 Essential (primary) hypertension: Secondary | ICD-10-CM | POA: Diagnosis present

## 2021-04-03 DIAGNOSIS — B009 Herpesviral infection, unspecified: Secondary | ICD-10-CM | POA: Diagnosis not present

## 2021-04-03 DIAGNOSIS — K7469 Other cirrhosis of liver: Secondary | ICD-10-CM | POA: Diagnosis not present

## 2021-04-03 DIAGNOSIS — E8809 Other disorders of plasma-protein metabolism, not elsewhere classified: Secondary | ICD-10-CM | POA: Diagnosis present

## 2021-04-03 DIAGNOSIS — E861 Hypovolemia: Secondary | ICD-10-CM

## 2021-04-03 DIAGNOSIS — K766 Portal hypertension: Secondary | ICD-10-CM | POA: Diagnosis present

## 2021-04-03 DIAGNOSIS — K7031 Alcoholic cirrhosis of liver with ascites: Secondary | ICD-10-CM | POA: Diagnosis present

## 2021-04-03 DIAGNOSIS — Z20822 Contact with and (suspected) exposure to covid-19: Secondary | ICD-10-CM | POA: Diagnosis present

## 2021-04-03 DIAGNOSIS — K859 Acute pancreatitis without necrosis or infection, unspecified: Secondary | ICD-10-CM | POA: Diagnosis present

## 2021-04-03 DIAGNOSIS — I959 Hypotension, unspecified: Secondary | ICD-10-CM | POA: Diagnosis present

## 2021-04-03 DIAGNOSIS — I9589 Other hypotension: Secondary | ICD-10-CM

## 2021-04-03 DIAGNOSIS — K852 Alcohol induced acute pancreatitis without necrosis or infection: Secondary | ICD-10-CM | POA: Diagnosis not present

## 2021-04-03 DIAGNOSIS — E872 Acidosis: Secondary | ICD-10-CM | POA: Diagnosis present

## 2021-04-03 DIAGNOSIS — Z515 Encounter for palliative care: Secondary | ICD-10-CM | POA: Diagnosis not present

## 2021-04-03 DIAGNOSIS — Z825 Family history of asthma and other chronic lower respiratory diseases: Secondary | ICD-10-CM | POA: Diagnosis not present

## 2021-04-03 DIAGNOSIS — R52 Pain, unspecified: Secondary | ICD-10-CM | POA: Diagnosis not present

## 2021-04-03 DIAGNOSIS — N3000 Acute cystitis without hematuria: Secondary | ICD-10-CM | POA: Diagnosis present

## 2021-04-03 DIAGNOSIS — R1084 Generalized abdominal pain: Secondary | ICD-10-CM | POA: Diagnosis not present

## 2021-04-03 DIAGNOSIS — Z6841 Body Mass Index (BMI) 40.0 and over, adult: Secondary | ICD-10-CM | POA: Diagnosis not present

## 2021-04-03 DIAGNOSIS — K7011 Alcoholic hepatitis with ascites: Secondary | ICD-10-CM | POA: Diagnosis present

## 2021-04-03 DIAGNOSIS — Y92239 Unspecified place in hospital as the place of occurrence of the external cause: Secondary | ICD-10-CM | POA: Diagnosis not present

## 2021-04-03 DIAGNOSIS — K7200 Acute and subacute hepatic failure without coma: Secondary | ICD-10-CM | POA: Diagnosis not present

## 2021-04-03 DIAGNOSIS — F1110 Opioid abuse, uncomplicated: Secondary | ICD-10-CM | POA: Diagnosis present

## 2021-04-03 DIAGNOSIS — K7040 Alcoholic hepatic failure without coma: Secondary | ICD-10-CM | POA: Diagnosis present

## 2021-04-03 LAB — CBC
HCT: 39.3 % (ref 39.0–52.0)
Hemoglobin: 14.3 g/dL (ref 13.0–17.0)
MCH: 37 pg — ABNORMAL HIGH (ref 26.0–34.0)
MCHC: 36.4 g/dL — ABNORMAL HIGH (ref 30.0–36.0)
MCV: 101.8 fL — ABNORMAL HIGH (ref 80.0–100.0)
Platelets: 54 10*3/uL — ABNORMAL LOW (ref 150–400)
RBC: 3.86 MIL/uL — ABNORMAL LOW (ref 4.22–5.81)
RDW: 18.4 % — ABNORMAL HIGH (ref 11.5–15.5)
WBC: 5.5 10*3/uL (ref 4.0–10.5)
nRBC: 0 % (ref 0.0–0.2)

## 2021-04-03 LAB — COMPREHENSIVE METABOLIC PANEL
ALT: 63 U/L — ABNORMAL HIGH (ref 0–44)
AST: 133 U/L — ABNORMAL HIGH (ref 15–41)
Albumin: 2.4 g/dL — ABNORMAL LOW (ref 3.5–5.0)
Alkaline Phosphatase: 77 U/L (ref 38–126)
Anion gap: 15 (ref 5–15)
BUN: 46 mg/dL — ABNORMAL HIGH (ref 6–20)
CO2: 22 mmol/L (ref 22–32)
Calcium: 8.2 mg/dL — ABNORMAL LOW (ref 8.9–10.3)
Chloride: 90 mmol/L — ABNORMAL LOW (ref 98–111)
Creatinine, Ser: UNDETERMINED mg/dL (ref 0.61–1.24)
Glucose, Bld: 121 mg/dL — ABNORMAL HIGH (ref 70–99)
Potassium: 4 mmol/L (ref 3.5–5.1)
Sodium: 127 mmol/L — ABNORMAL LOW (ref 135–145)
Total Bilirubin: 35.1 mg/dL (ref 0.3–1.2)
Total Protein: 7.4 g/dL (ref 6.5–8.1)

## 2021-04-03 LAB — HEPATITIS PANEL, ACUTE
HCV Ab: REACTIVE — AB
Hep A IgM: NONREACTIVE
Hep B C IgM: NONREACTIVE
Hepatitis B Surface Ag: NONREACTIVE

## 2021-04-03 LAB — PROTIME-INR
INR: 2.7 — ABNORMAL HIGH (ref 0.8–1.2)
Prothrombin Time: 28.4 seconds — ABNORMAL HIGH (ref 11.4–15.2)

## 2021-04-03 LAB — MRSA NEXT GEN BY PCR, NASAL: MRSA by PCR Next Gen: DETECTED — AB

## 2021-04-03 LAB — OSMOLALITY, URINE: Osmolality, Ur: 320 mOsm/kg (ref 300–900)

## 2021-04-03 LAB — HIV ANTIBODY (ROUTINE TESTING W REFLEX): HIV Screen 4th Generation wRfx: NONREACTIVE

## 2021-04-03 LAB — CREATININE, URINE, RANDOM: Creatinine, Urine: 341 mg/dL

## 2021-04-03 LAB — GLUCOSE, CAPILLARY: Glucose-Capillary: 99 mg/dL (ref 70–99)

## 2021-04-03 LAB — SODIUM, URINE, RANDOM: Sodium, Ur: 18 mmol/L

## 2021-04-03 LAB — SARS CORONAVIRUS 2 (TAT 6-24 HRS): SARS Coronavirus 2: NEGATIVE

## 2021-04-03 MED ORDER — HYDROMORPHONE HCL 1 MG/ML IJ SOLN
1.0000 mg | INTRAMUSCULAR | Status: DC | PRN
  Administered 2021-04-03 – 2021-04-07 (×7): 1 mg via INTRAVENOUS
  Filled 2021-04-03 (×7): qty 1

## 2021-04-03 MED ORDER — LACTATED RINGERS IV SOLN: INTRAVENOUS | Status: DC

## 2021-04-03 MED ORDER — PREDNISONE 20 MG PO TABS
40.0000 mg | ORAL_TABLET | Freq: Every day | ORAL | Status: DC
  Administered 2021-04-04 – 2021-04-12 (×9): 40 mg via ORAL
  Filled 2021-04-03 (×2): qty 2
  Filled 2021-04-03: qty 4
  Filled 2021-04-03 (×2): qty 2
  Filled 2021-04-03 (×2): qty 4
  Filled 2021-04-03 (×2): qty 2

## 2021-04-03 MED ORDER — MUPIROCIN 2 % EX OINT
TOPICAL_OINTMENT | Freq: Two times a day (BID) | CUTANEOUS | Status: DC
  Administered 2021-04-03 – 2021-04-12 (×6): 1 via NASAL
  Filled 2021-04-03: qty 22

## 2021-04-03 MED ORDER — LORAZEPAM 1 MG PO TABS
1.0000 mg | ORAL_TABLET | ORAL | Status: AC | PRN
  Administered 2021-04-03 (×4): 1 mg via ORAL
  Filled 2021-04-03 (×4): qty 1

## 2021-04-03 MED ORDER — LORAZEPAM 2 MG/ML IJ SOLN
1.0000 mg | INTRAMUSCULAR | Status: AC | PRN
  Administered 2021-04-03: 1 mg via INTRAVENOUS
  Filled 2021-04-03: qty 1

## 2021-04-03 MED ORDER — LACTATED RINGERS IV SOLN: INTRAVENOUS | Status: AC

## 2021-04-03 MED ORDER — PANTOPRAZOLE SODIUM 40 MG PO TBEC
40.0000 mg | DELAYED_RELEASE_TABLET | Freq: Two times a day (BID) | ORAL | Status: DC
  Administered 2021-04-03 – 2021-04-12 (×17): 40 mg via ORAL
  Filled 2021-04-03 (×17): qty 1

## 2021-04-03 MED ORDER — SODIUM CHLORIDE 0.9 % IV SOLN
1.0000 g | INTRAVENOUS | Status: AC
  Administered 2021-04-04 – 2021-04-07 (×4): 1 g via INTRAVENOUS
  Filled 2021-04-03 (×3): qty 1
  Filled 2021-04-03: qty 10
  Filled 2021-04-03: qty 1

## 2021-04-03 MED ORDER — FOLIC ACID 1 MG PO TABS
1.0000 mg | ORAL_TABLET | Freq: Every day | ORAL | Status: DC
  Administered 2021-04-03 – 2021-04-12 (×10): 1 mg via ORAL
  Filled 2021-04-03 (×10): qty 1

## 2021-04-03 MED ORDER — THIAMINE HCL 100 MG/ML IJ SOLN
100.0000 mg | Freq: Every day | INTRAMUSCULAR | Status: DC
Start: 2021-04-03 — End: 2021-04-12
  Administered 2021-04-05 – 2021-04-12 (×3): 100 mg via INTRAVENOUS
  Filled 2021-04-03 (×4): qty 2

## 2021-04-03 MED ORDER — THIAMINE HCL 100 MG PO TABS
100.0000 mg | ORAL_TABLET | Freq: Every day | ORAL | Status: DC
  Administered 2021-04-03 – 2021-04-11 (×7): 100 mg via ORAL
  Filled 2021-04-03 (×7): qty 1

## 2021-04-03 MED ORDER — ALBUMIN HUMAN 25 % IV SOLN
50.0000 g | Freq: Once | INTRAVENOUS | Status: AC
  Administered 2021-04-03: 50 g via INTRAVENOUS
  Filled 2021-04-03: qty 200

## 2021-04-03 MED ORDER — ADULT MULTIVITAMIN W/MINERALS CH
1.0000 | ORAL_TABLET | Freq: Every day | ORAL | Status: DC
Start: 2021-04-03 — End: 2021-04-12
  Administered 2021-04-03 – 2021-04-12 (×10): 1 via ORAL
  Filled 2021-04-03 (×10): qty 1

## 2021-04-03 MED ORDER — CHLORHEXIDINE GLUCONATE CLOTH 2 % EX PADS
6.0000 | MEDICATED_PAD | Freq: Every day | CUTANEOUS | Status: DC
  Administered 2021-04-03 – 2021-04-12 (×11): 6 via TOPICAL

## 2021-04-03 NOTE — Consult Note (Signed)
Midge Minium, MD [facility]  [address], [address] [place], [place] [address] Phone: [phone] Fax : [phone]  Consultation  Referring Provider:     Dr. Cyndia Bent Primary Care Physician:  [name], MD Primary Gastroenterologist:  [facility]        Reason for Consultation:     Liver failure  Date of Admission:  04/02/2021 Date of Consultation:  04/03/2021         HPI:   Oscar Poole is a 60 y.o. male who came in with abdominal pain and decreased urine output.  This patient reports that he has a history of hepatitis C with chronic alcohol and opioid abuse.  The patient reports that he stopped drinking 4-5 days ago because of his symptoms.  The patient has had nausea and vomiting with diffuse abdominal pain for that same amount of time. The ER had called me last night about this patient and I recommended a discriminant function be calculated and if elevated he should be started on steroids. The patient's discriminate function was close to 100 with normal being less than 32. The patient's meld score could not be calculated due to his creatinine not being able to be cannulated due to his bilirubin. The patient Meld score, if his Creatinine was assumed to be one would be of value of 33 which has a 53%  3 months mortality. If his creatinine is closer to 3 then he has a Meld score of 40 with a 71% three-month mortality. The patient had a CT scan of the abdomen that showed:  IMPRESSION: 1. Fat stranding and inflammatory changes adjacent to the distal duodenum and uncinate process of the pancreas, favored to be duodenal in origin rather than pancreatic. Peptic ulcer disease or duodenitis are considered. Recommend correlation with pancreatic enzymes. 2. Hepatic cirrhosis with portal hypertension. Splenomegaly and portosystemic collaterals. Small volume abdominopelvic ascites. 3. Trace left pleural effusion. 4. Mild low-density wall thickening of the ascending colon, may be secondary to portal  hypertension or colitis. 5. Bilateral inguinal hernias, right greater than left, containing fat and small volume ascites. Small umbilical hernia with recannulized umbilical vein. 6. Hiatal hernia as well as fat and small volume ascites extending through the diaphragmatic hiatus.   Aortic Atherosclerosis (ICD10-I70.0).  The patient was thought to possibly have hepatitis B also but his most recent serology shows that he does not have hepatitis B antibody positive.  Past Medical History:  Diagnosis Date   Alcoholic (HCC)    Back pain    Hepatitis B    Hepatitis C     Past Surgical History:  Procedure Laterality Date   EYE SURGERY     HERNIA REPAIR      Prior to Admission medications   Medication Sig Start Date End Date Taking? Authorizing Provider  amLODipine (NORVASC) 5 MG tablet Take 5 mg by mouth every morning. 03/10/21  Yes [provider]  famotidine (PEPCID) 20 MG tablet Take 20 mg by mouth 2 (two) times daily. 12/25/20  Yes [provider]  gabapentin (NEURONTIN) 300 MG capsule Take 900 mg by mouth 2 (two) times daily. 03/20/21  Yes [provider]  hydrochlorothiazide (HYDRODIURIL) 25 MG tablet Take 25 mg by mouth every morning. 03/10/21  Yes [provider]  lisinopril (ZESTRIL) 40 MG tablet Take 40 mg by mouth daily. 03/15/21  Yes [provider]  PROAIR HFA 108 (90 Base) MCG/ACT inhaler Inhale 1-2 puffs into the lungs every 4 (four) hours as  needed. 03/10/21  Yes [provider]  SUBOXONE 8-2 MG FILM Place 1 Film under the tongue 3 (three) times daily. 03/19/21  Yes [provider]    Family History  Problem Relation Age of Onset   Emphysema Mother 2673   Heart attack Father 6565   Heart attack Brother 5554   Prostate cancer Neg Hx    Kidney cancer Neg Hx    Bladder Cancer Neg Hx      Social History   Tobacco Use   Smoking status: Never   Smokeless tobacco: Never  Substance Use Topics   Alcohol use: Yes     Alcohol/week: 0.0 standard drinks    Comment: 6 10% alcohol beers per day   Drug use: Not Currently    Allergies as of 04/02/2021   (No Known Allergies)    Review of Systems:    All systems reviewed and negative except where noted in HPI.   Physical Exam:  Vital signs in last 24 hours: Temp:  [97.8 F (36.6 C)-98.2 F (36.8 C)] 98.2 F (36.8 C) (08/13 0800) Pulse Rate:  [81-95] 91 (08/13 1300) Resp:  [13-30] 30 (08/13 1300) BP: (89-178)/(60-133) 142/68 (08/13 1300) SpO2:  [89 %-98 %] 94 % (08/13 1300) FiO2 (%):  [28 %] 28 % (08/13 1000) Weight:  [161[148 kg] 148 kg (08/12 2102) Last BM Date:  (PTA) General:   Pleasant, cooperative in NAD Head:  Normocephalic and atraumatic. Eyes:   Positive icterus.   Conjunctiva Yellow. PERRLA. Ears:  Normal auditory acuity. Neck:  Supple; no masses or thyroidomegaly Lungs: Respirations even and unlabored. Lungs clear to auscultation bilaterally.   No wheezes, crackles, or rhonchi.  Heart:  Regular rate and rhythm;  Without murmur, clicks, rubs or gallops Abdomen:  Soft, nondistended, nontender. Normal bowel sounds. No appreciable masses or hepatomegaly.  No rebound or guarding.  Rectal:  Not performed. Msk:  Symmetrical without gross deformities.    Extremities:  Without edema, cyanosis or clubbing. Neurologic:  Alert and oriented x3;  grossly normal neurologically. Skin:  Intact without significant lesions or rashes. Cervical Nodes:  No significant cervical adenopathy. Psych:  Alert and cooperative. Normal affect.  LAB RESULTS: Recent Labs    04/02/21 2108 04/03/21 1052  WBC 5.2 5.5  HGB 15.1 14.3  HCT 41.4 39.3  PLT 97* 54*   BMET Recent Labs    04/02/21 2108 04/03/21 1052  NA 126* 127*  K 2.8* 4.0  CL 88* 90*  CO2 21* 22  GLUCOSE 132* 121*  BUN 42* 46*  CREATININE UNABLE TO REPORT DUE TO ICTERUS DLB UNABLE TO REPORT DUE TO ICTERUS  CALCIUM 8.4* 8.2*   LFT Recent Labs    04/02/21 2108 04/03/21 1052  PROT 7.9 7.4   ALBUMIN 2.1* 2.4*  AST 147* 133*  ALT 64* 63*  ALKPHOS 108 77  BILITOT 22.2* 35.1*  BILIDIR 18.3*  --    PT/INR Recent Labs    04/02/21 2151 04/03/21 1052  LABPROT 27.9* 28.4*  INR 2.6* 2.7*    STUDIES: CT ABDOMEN PELVIS WO CONTRAST  Result Date: 04/02/2021 CLINICAL DATA:  Acute abdominal pain. Patient states he was unable to urinate for 5 days. Lower extremity edema. Vomiting and constipation. Jaundice. EXAM: CT ABDOMEN AND PELVIS WITHOUT CONTRAST TECHNIQUE: Multidetector CT imaging of the abdomen and pelvis was performed following the standard protocol without IV contrast. COMPARISON:  Remote abdominal CT 10/24/2010 FINDINGS: Lower chest: Trace left pleural effusion. Mild dependent atelectasis in both lower lobes. Heart  is mildly enlarged. There is a hiatal hernia with herniation of fat and ascites through the diaphragmatic defect. Hepatobiliary: Diffusely decreased hepatic density typical of steatosis. The liver is enlarged spanning 20.7 cm cranial caudal. There is capsular nodularity. No obvious focal hepatic lesion on this unenhanced exam. Gallbladder is distended. No calcified gallstone. Common bile duct is not well-defined, however there is no biliary dilatation. Pancreas: Fatty atrophy of the pancreas. There is stranding adjacent to the duodenum and uncinate process of the pancreas, favored to be duodenal in origin rather than pancreatic. No pancreatic ductal dilatation. Spleen: Splenomegaly with spleen spanning 18 x 7.7 x 14.6 cm (volume = 1100 cm^3). No obvious focal abnormality on this unenhanced exam. Adrenals/Urinary Tract: No adrenal nodule. No hydronephrosis or renal calculi. Mild symmetric perinephric edema, nonspecific. No evidence of focal renal lesion. Urinary bladder is near completely empty. Stomach/Bowel: Bowel assessment is limited in the absence of enteric contrast and presence of ascites. Stomach mildly distended with fluid or ingested material. There is a small hiatal  hernia. 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 There is fat stranding and inflammatory changes adjacent to the distal duodenum, for example series 2, image 51. remainder of the small bowel is decompressed. No obstruction. The appendix is not definitively visualized on the current exam. Mild low-density wall thickening of the ascending colon. Remainder of the colon is decompressed and not well assessed. Vascular/Lymphatic: Normal caliber abdominal aorta with mild atherosclerosis. Left upper quadrant collaterals with varices coursing into the left lateral abdomen. Recannulated umbilical vein. There is no portal venous or mesenteric gas. Multiple prominent gastrohepatic and retroperitoneal nodes are likely reactive. There are few prominent bilateral inguinal nodes. Reproductive: Prostate is unremarkable. Other: Small volume abdominopelvic ascites with free fluid in the upper quadrants, both pericolic gutters, and in the pelvis. No free air or focal fluid collection. Right inguinal hernia contains fat and small volume ascites. Small left inguinal hernia contains fat. There is a small umbilical hernia with recannulized umbilical vein. Mild generalize subcutaneous edema. Musculoskeletal: Diffuse degenerative change in the spine. Modic endplate changes at T11-T12. No focal bone lesion. IMPRESSION: 1. Fat stranding and inflammatory changes adjacent to the distal duodenum and uncinate process of the pancreas, favored to be duodenal in origin rather than pancreatic. Peptic ulcer disease or duodenitis are considered. Recommend correlation with pancreatic enzymes. 2. Hepatic cirrhosis with portal hypertension. Splenomegaly and portosystemic collaterals. Small volume abdominopelvic ascites. 3. Trace left pleural effusion. 4. Mild low-density wall thickening of the ascending colon, may be secondary to portal hypertension or colitis. 5. Bilateral inguinal hernias, right greater than left, containing fat and small volume ascites. Small  umbilical hernia with recannulized umbilical vein. 6. Hiatal hernia as well as fat and small volume ascites extending through the diaphragmatic hiatus. Aortic Atherosclerosis (ICD10-I70.0). Electronically Signed   By: Narda Rutherford M.D.   On: 04/02/2021 23:14   DG Chest Portable 1 View  Result Date: 04/02/2021 CLINICAL DATA:  Cough. Patient reports he has been able to urinate for 5 days. Lower extremity edema. Shortness of breath. Abdominal pain. EXAM: PORTABLE CHEST 1 VIEW COMPARISON:  Radiograph 11/14/2015. FINDINGS: The heart is enlarged. There is vascular congestion. No focal airspace disease. No pleural fluid or pneumothorax. No acute osseous abnormalities are seen. IMPRESSION: Cardiomegaly with vascular congestion. Electronically Signed   By: Narda Rutherford M.D.   On: 04/02/2021 21:58      Impression / Plan:   Assessment: Principal Problem:   Liver failure without hepatic coma (HCC) Active Problems:   Alcohol  intoxication (HCC)   Hypokalemia   Acute renal failure (HCC)   Acute pancreatitis   Hypotension   Hyponatremia   Oscar Poole is a 60 y.o. y/o male with end-stage liver disease with a history of hepatitis C and alcohol abuse.  The patient's meld score could not be calculated because of his creatinine but is somewhere between 33 and 40 having him a 53 to 71, 3 month mortality.  The patient's discriminate function was close to 100 and he has been started on steroids which will decrease hospital mortality but does not affect long-term survival. The patient's abnormal Pancreatic enzymes are likely due to inflammation of the duodenum.  Plan:  This patient should have supportive care with continued steroids and have discussed a Palliative care consult with the patient and he is amenable to that.  If the patient's renal function does not improve then he likely has hepatorenal syndrome which carries even a worse prognosis than his already poor prognosis.  The patient will also be  started on a PPI for possible duodenitis. I have put in a consult for palliative care. I have explained this to the patient.  Thank you for involving me in the care of this patient.      LOS: 0 days   Midge Minium, MD, [facility] 04/03/2021, 2:57 PM,  Pager [phone] 7am-5pm  Check AMION for 5pm -7am coverage and on weekends   Note: This dictation was prepared with Dragon dictation along with smaller phrase technology. Any transcriptional errors that result from this process are unintentional.

## 2021-04-03 NOTE — Progress Notes (Signed)
PROGRESS NOTE  Oscar Poole  DOB: 1960/10/03  PCP: Mendel Ryder, MD MPN:[id number]  DOA: 04/02/2021  LOS: 0 days  Hospital Day: 2   Chief Complaint  Patient presents with   Abdominal Pain    Brief narrative: Oscar Poole is a 60 y.o. male with PMH significant for chronic hepatitis C, history of alcohol and opiate use on Suboxone, hypertension. Patient presented to the ED on 8/12 with complaint of worsening abdominal pain, constipation and decreased urine output. Abdominal pain diffuse for 4 to 5 days associated with nausea vomiting, no bowel movement in 4 to 5 days.  Because of constipation for the duration, he stopped Suboxone.  No urine output in 4 to 5 days. Reports drinking about 5-6 cocktails daily but is unable to do so for the last 5 days because of which he started having tremors.  In the ED, patient was afebrile, blood pressure was initially elevated 278/133 but later on dipped down to 89/62 in few hours Labs with significant abnormality including sodium low at 126, potassium low at 2.8, lipase elevated to 342, AST/ALT/alk phos at 147/64/108, total bilirubin elevated to 22 with direct component significantly elevated to 18, creatinine was unable to calculate because of high level of bilirubin CBC with hemoglobin 15, MCV 102, platelet 97, INR 2.6 Tylenol level less than 10.1.  CT abdomen pelvis showed concern of duodenitis, evidence of hepatic cirrhosis with portal hypertension, small volume ascites.  CBD not well defined but no evidence of biliary dilatation. Admitted to hospitalist service GI was consulted. See below for details  Subjective: Patient was seen and examined this morning.  Pleasant middle-aged Caucasian male.  Morbidly obese.  Looks older for his age. Pain controlled. Hemodynamically stable overnight, on 2 L oxygen by nasal cannula  Assessment/Plan: Acute pancreatitis/duodenitis -Presented with progressively worsening abdominal pain in the setting of  heavy alcohol use -Lipase level elevated, liver enzymes elevated -CT abdomen findings are more suggestive of duodenitis and pancreatitis. 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  Repeat labs tomorrow. -IV Rocephin for SBP prophylaxis -Currently on oral prednisone per GI recommendation. -Not sure if he has enough ascites for paracentesis as the CT scan mentions only small volume ascites. Recent Labs  Lab 04/02/21 2108 04/03/21 1052  AST 147* 133*  ALT 64* 63*  ALKPHOS 108 77  BILITOT 22.2* 35.1*  PROT 7.9 7.4  ALBUMIN 2.1* 2.4*   Recent Labs  Lab 04/02/21 2151 04/03/21 1052  INR 2.6* 2.7*   Acute kidney injury -Creatinine is unable to assess because of high bilirubin level.  But clinically patient is making minimal urine output.  We will see how he responds to fluid challenge.  He may be heading to hepatorenal syndrome. Recent Labs    04/02/21 2108 04/03/21 1052  BUN 42* 46*  CREATININE UNABLE TO REPORT DUE TO ICTERUS DLB UNABLE TO REPORT DUE TO ICTERUS     Essential hypertension -Home meds include amlodipine 5 mg daily, HCTZ 25 mg daily, lisinopril 40 mg daily.  Patient reports compliance to home meds. -Blood pressure was elevated to 170s initially but later dipped down to 80s. -Keep all meds on hold  at this time.  Currently on IV fluid. -Because of coexisting portal hypertension, patient may benefit from initiation of beta-blocker and diuretics prior to discharge    Hyponatremia -Likely hypervolemic hyponatremia related to portal hypertension, probably also contributed by low solute intake  in last 4 to 5 days. -Currently on LR at 50 mill per hour. Recent Labs  Lab 04/02/21 2108 04/03/21 1052  NA 126* 127*   Hypokalemia -Potassium level significantly low at 2.8 on admission.  40 mEq oral potassium was given.  Repeat BMP this morning showed potassium level improvement to 4 Recent Labs  Lab 04/02/21 2108 04/03/21 1052  K 2.8* 4.0  MG 2.2  --    Chronic alcoholism -On CIWA protocol with as needed Ativan  Chronic opioid use -On Suboxone which he has not been taking for last 5 days.  Watch out for opiate withdrawal.  Mobility: PT eval Code Status:   Code Status: Full Code  Nutritional status: Body mass index is 44.25 kg/m.     Diet:  Diet Order             Diet Heart Room service appropriate? Yes; Fluid consistency: Thin  Diet effective now                  DVT prophylaxis:  SCDs Start: 04/03/21 0032   Antimicrobials: IV Rocephin Fluid: LR$RemoveBeforeDEI'@100'[id number]$  mill per hour Consultants: GI, palliative care consulted Family Communication: None at bedside  Status is: Inpatient  Remains inpatient appropriate because: Critically ill  Dispo: The patient is from: Home              Anticipated d/c is to: Depends on clinical improvement              Patient currently is not medically stable to d/c.   Difficult to place patient No     Infusions:   [START ON 04/04/2021] cefTRIAXone (ROCEPHIN)  IV     lactated ringers 100 mL/hr at 04/03/21 1600    Scheduled Meds:  Chlorhexidine Gluconate Cloth  6 each Topical Daily   folic acid  1 mg Oral Daily   multivitamin with minerals  1 tablet Oral Daily   mupirocin ointment   Nasal BID   pantoprazole  40 mg Oral BID AC   [START ON 04/04/2021] predniSONE  40 mg Oral Q breakfast   thiamine  100 mg Oral Daily   Or   thiamine  100 mg Intravenous Daily    Antimicrobials: Anti-infectives (From admission, onward)    Start     Dose/Rate Route Frequency Ordered Stop   04/04/21 2300  cefTRIAXone (ROCEPHIN) 1 g in sodium  chloride 0.9 % 100 mL IVPB        1 g 200 mL/hr over 30 Minutes Intravenous Every 24 hours 04/03/21 0035     04/02/21 2315  cefTRIAXone (ROCEPHIN) 1 g in sodium chloride 0.9 % 100 mL IVPB        1 g 200 mL/hr over 30 Minutes Intravenous  Once 04/02/21 2313 04/02/21 2348       PRN meds: HYDROmorphone (DILAUDID) injection, LORazepam **OR** LORazepam   Objective: Vitals:   04/03/21 1500 04/03/21 1600  BP: (!) 111/58 113/66  Pulse: 82 82  Resp: 13 12  Temp:    SpO2: 91% 93%    Intake/Output Summary (Last 24 hours) at 04/03/2021 1638 Last data filed at 04/03/2021 1600 Gross per 24 hour  Intake 1081.75 ml  Output 400 ml  Net 681.75 ml  Filed Weights   04/02/21 2102  Weight: (!) 148 kg   Weight change:  Body mass index is 44.25 kg/m.   Physical Exam: General exam: Pleasant, middle-aged morbidly obese Caucasian male.  Looks older for his age Skin: No rashes, lesions or ulcers. HEENT: Atraumatic, normocephalic, no obvious bleeding Lungs: Diminished air entry at both bases CVS: Regular rate and rhythm, no murmur GI/Abd soft, distended from obesity and ascites.  Mild tenderness in the epigastrium CNS: Alert, awake, able to answer slowly Psychiatry: Depressed look Extremities: No pedal edema, no calf tenderness  Data Review: I have personally reviewed the laboratory data and studies available.  Recent Labs  Lab 04/02/21 2108 04/03/21 1052  WBC 5.2 5.5  HGB 15.1 14.3  HCT 41.4 39.3  MCV 101.7* 101.8*  PLT 97* 54*   Recent Labs  Lab 04/02/21 2108 04/03/21 1052  NA 126* 127*  K 2.8* 4.0  CL 88* 90*  CO2 21* 22  GLUCOSE 132* 121*  BUN 42* 46*  CREATININE UNABLE TO REPORT DUE TO ICTERUS DLB UNABLE TO REPORT DUE TO ICTERUS  CALCIUM 8.4* 8.2*  MG 2.2  --     F/u labs ordered Unresulted Labs (From admission, onward)     Start     Ordered   04/04/21 0459  Basic metabolic panel  Daily,   R      04/03/21 1637   04/04/21 0500  CBC with Differential/Platelet   Daily,   R      04/03/21 1637   04/04/21 0500  Magnesium  Tomorrow morning,   R        04/03/21 1637   04/04/21 0500  Phosphorus  Tomorrow morning,   R        04/03/21 1637   04/03/21 0045  Hepatitis panel, acute  Once,   STAT        04/03/21 0044   04/02/21 2323  HIV Antibody (routine testing w rflx)  (HIV Antibody (Routine testing w reflex) panel)  Once,   STAT        04/02/21 2322   04/02/21 2314  Urine Culture  Add-on,   AD       Question:  Indication  Answer:  Suprapubic pain   04/02/21 2313            Signed, Terrilee Croak, MD [facility] 04/03/2021

## 2021-04-03 NOTE — Plan of Care (Signed)
  Problem: Education: Goal: Knowledge of General Education information will improve Description: Including pain rating scale, medication(s)/side effects and non-pharmacologic comfort measures Outcome: Progressing   Problem: Clinical Measurements: Goal: Respiratory complications will improve Outcome: Progressing   Problem: Clinical Measurements: Goal: Ability to maintain clinical measurements within normal limits will improve Outcome: Not Progressing Goal: Will remain free from infection Outcome: Not Progressing Goal: Diagnostic test results will improve Outcome: Not Progressing

## 2021-04-03 NOTE — H&P (Addendum)
History and Physical    Oscar Poole KWI:[id number] DOB: 31-Aug-1960 DOA: 04/02/2021  PCP: [name], MD  Patient coming from: Home  I have personally briefly reviewed patient's old medical records in [facility]  Chief Complaint: Abdominal pain, constipation and minimal urine output  HPI: Oscar Poole is a 60 y.o. male with medical history significant for chronic hepatitis C, hypertension, history of alcohol and opioid abuse on Suboxone who presents with concerns of worsening abdominal pain, constipation and decreased urine output.  Patient reports that for the past 4 to 5 days he has been having diffuse abdominal pain with nausea and vomiting.  Has been unable to have a bowel movement for 4 to 5 days.  Stop his Suboxone 5 days ago due to constipation.  Has had minimal urine output around 5 days.  Reports drinking about 5-6 cocktails daily but quit 4 to 5 days ago.  Has felt more tremulous.  Denies any confusion.  Denies other illicit drug use.  Has had lower extremity edema for months. Endorse compliant with his antihypertensives.  ED Course: He was afebrile, hypotensive down to systolic of 90s over 60s with mild tachypnea on room air. Lab work significant for total bilirubin of 22, anion gap of 17, AST of 147, ALT of 64, normal alkaline phosphatase, INR of 2.6. Ammonia of 50. Lipase of 342. Sodium of 126, K of 2.8, BG of 132 Creatinine was incalculable due to significantly elevated bilirubin  CT of the abdomen and pelvis was obtained showing fat stranding and inflammatory changes around the duodenum and pancreas although peptic ulcer disease or duodenitis is more likely rather than pancreatitis.  There is hepatic cirrhosis with portal hypertension and small volume abdominal pelvic ascites.  ED physician discussed with GI Dr. Servando Snare recommends starting patient on steroids and will see in consultation in the morning.  Patient currently not a liver transplant candidate due to  ongoing alcohol use.  Hospitalist then called for admission. Review of Systems: Constitutional: No Weight Change, No Fever ENT/Mouth: No sore throat, No Rhinorrhea Eyes: No Eye Pain, No Vision Changes Cardiovascular: No Chest Pain, no SOB, + Edema, No Palpitations Respiratory: No Cough, No Sputum, No Wheezing, no Dyspnea  Gastrointestinal: + Nausea, +Vomiting, No Diarrhea, + Constipation, + Pain Genitourinary: no Urinary Incontinence, No Urgency, No Flank Pain Musculoskeletal: No Arthralgias, No Myalgias Skin: No Skin Lesions, No Pruritus, Neuro: no Weakness, No Numbness,  No Loss of Consciousness, No Syncope Psych: No Anxiety/Panic, No Depression, no decrease appetite Heme/Lymph: No Bruising, No Bleeding  Past Medical History:  Diagnosis Date   Alcoholic (HCC)    Back pain    Hepatitis B    Hepatitis C     Past Surgical History:  Procedure Laterality Date   EYE SURGERY     HERNIA REPAIR       reports that he has never smoked. He has never used smokeless tobacco. He reports current alcohol use. He reports that he does not currently use drugs. Social History  No Known Allergies  Family History  Problem Relation Age of Onset   Emphysema Mother 62   Heart attack Father 46   Heart attack Brother 14   Prostate cancer Neg Hx    Kidney cancer Neg Hx    Bladder Cancer Neg Hx      Prior to Admission medications   Medication Sig Start Date End Date Taking? Authorizing Provider  amLODipine (NORVASC) 5 MG tablet Take 5 mg by mouth every  morning. 03/10/21  Yes [provider]  famotidine (PEPCID) 20 MG tablet Take 20 mg by mouth 2 (two) times daily. 12/25/20  Yes [provider]  gabapentin (NEURONTIN) 300 MG capsule Take 900 mg by mouth 2 (two) times daily. 03/20/21  Yes [provider]  hydrochlorothiazide (HYDRODIURIL) 25 MG tablet Take 25 mg by mouth every morning. 03/10/21  Yes [provider]  lisinopril (ZESTRIL) 40 MG tablet Take 40 mg by  mouth daily. 03/15/21  Yes [provider]  PROAIR HFA 108 (90 Base) MCG/ACT inhaler Inhale 1-2 puffs into the lungs every 4 (four) hours as needed. 03/10/21  Yes [provider]  SUBOXONE 8-2 MG FILM Place 1 Film under the tongue 3 (three) times daily. 03/19/21  Yes [provider]    Physical Exam: Vitals:   04/02/21 2300 04/02/21 2315 04/02/21 2330 04/02/21 2350  BP:    115/87  Pulse: 86 84 82 81  Resp: (!) 21 17 20  (!) 21  Temp:      TempSrc:      SpO2: 96% 95% (!) 89% 93%  Weight:      Height:        Constitutional: Lethargic, ill-appearing male with generalized jaundice laying flat in bed Vitals:   04/02/21 2300 04/02/21 2315 04/02/21 2330 04/02/21 2350  BP:    115/87  Pulse: 86 84 82 81  Resp: (!) 21 17 20  (!) 21  Temp:      TempSrc:      SpO2: 96% 95% (!) 89% 93%  Weight:      Height:       Eyes: PERRL, conjunctivae with bilateral scleral icterus ENMT: Mucous membranes are moist.  Neck: normal, supple Respiratory: clear to auscultation bilaterally, no wheezing, no crackles. Normal respiratory effort. No accessory muscle use.  Cardiovascular: Regular rate and rhythm, no murmurs / rubs / gallops.  +3 pretibial bilateral lower extremity edema  abdomen: Large obese distended abdomen with ascites, difficulty to assess for hepatomegaly due to obese abdomen..  No significant tenderness, guarding rigidity rebound tenderness with palpation musculoskeletal: no clubbing / cyanosis. No joint deformity upper and lower extremities. Normal muscle tone.  Skin: Mild ecchymosis noted on abdomen Neurologic: CN 2-12 grossly intact. Sensation intact. Strength 5/5 in all 4.  Asterixis noted Psychiatric: Normal judgment and insight. Alert and oriented x 3. Normal mood.    Labs on Admission: I have personally reviewed following labs and imaging studies  CBC: Recent Labs  Lab 04/02/21 2108  WBC 5.2  HGB 15.1  HCT 41.4  MCV 101.7*  PLT 97*   Basic Metabolic  Panel: Recent Labs  Lab 04/02/21 2108  NA 126*  K 2.8*  CL 88*  CO2 21*  GLUCOSE 132*  BUN 42*  CREATININE UNABLE TO REPORT DUE TO ICTERUS DLB  CALCIUM 8.4*  MG 2.2   GFR: CrCl cannot be calculated (This lab value cannot be used to calculate CrCl because it is not a number: UNABLE TO REPORT DUE TO ICTERUS DLB). Liver Function Tests: Recent Labs  Lab 04/02/21 2108  AST 147*  ALT 64*  ALKPHOS 108  BILITOT 22.2*  PROT 7.9  ALBUMIN 2.1*   Recent Labs  Lab 04/02/21 2108  LIPASE 342*   Recent Labs  Lab 04/02/21 2108  AMMONIA 50*   Coagulation Profile: Recent Labs  Lab 04/02/21 2151  INR 2.6*   Cardiac Enzymes: No results for input(s): CKTOTAL, CKMB, CKMBINDEX, TROPONINI in the last 168 hours. BNP (last 3  results) No results for input(s): PROBNP in the last 8760 hours. HbA1C: No results for input(s): HGBA1C in the last 72 hours. CBG: No results for input(s): GLUCAP in the last 168 hours. Lipid Profile: No results for input(s): CHOL, HDL, LDLCALC, TRIG, CHOLHDL, LDLDIRECT in the last 72 hours. Thyroid Function Tests: No results for input(s): TSH, T4TOTAL, FREET4, T3FREE, THYROIDAB in the last 72 hours. Anemia Panel: No results for input(s): VITAMINB12, FOLATE, FERRITIN, TIBC, IRON, RETICCTPCT in the last 72 hours. Urine analysis:    Component Value Date/Time   COLORURINE AMBER (A) 04/02/2021 2107   APPEARANCEUR CLOUDY (A) 04/02/2021 2107   LABSPEC 1.012 04/02/2021 2107   PHURINE 5.0 04/02/2021 2107   GLUCOSEU 50 (A) 04/02/2021 2107   HGBUR MODERATE (A) 04/02/2021 2107   BILIRUBINUR MODERATE (A) 04/02/2021 2107   KETONESUR NEGATIVE 04/02/2021 2107   PROTEINUR NEGATIVE 04/02/2021 2107   NITRITE NEGATIVE 04/02/2021 2107   LEUKOCYTESUR NEGATIVE 04/02/2021 2107    Radiological Exams on Admission: CT ABDOMEN PELVIS WO CONTRAST  Result Date: 04/02/2021 CLINICAL DATA:  Acute abdominal pain. Patient states he was unable to urinate for 5 days. Lower extremity  edema. Vomiting and constipation. Jaundice. EXAM: CT ABDOMEN AND PELVIS WITHOUT CONTRAST TECHNIQUE: Multidetector CT imaging of the abdomen and pelvis was performed following the standard protocol without IV contrast. COMPARISON:  Remote abdominal CT 10/24/2010 FINDINGS: Lower chest: Trace left pleural effusion. Mild dependent atelectasis in both lower lobes. Heart is mildly enlarged. There is a hiatal hernia with herniation of fat and ascites through the diaphragmatic defect. Hepatobiliary: Diffusely decreased hepatic density typical of steatosis. The liver is enlarged spanning 20.7 cm cranial caudal. There is capsular nodularity. No obvious focal hepatic lesion on this unenhanced exam. Gallbladder is distended. No calcified gallstone. Common bile duct is not well-defined, however there is no biliary dilatation. Pancreas: Fatty atrophy of the pancreas. There is stranding adjacent to the duodenum and uncinate process of the pancreas, favored to be duodenal in origin rather than pancreatic. No pancreatic ductal dilatation. Spleen: Splenomegaly with spleen spanning 18 x 7.7 x 14.6 cm (volume = 1100 cm^3). No obvious focal abnormality on this unenhanced exam. Adrenals/Urinary Tract: No adrenal nodule. No hydronephrosis or renal calculi. Mild symmetric perinephric edema, nonspecific. No evidence of focal renal lesion. Urinary bladder is near completely empty. Stomach/Bowel: Bowel assessment is limited in the absence of enteric contrast and presence of ascites. Stomach mildly distended with fluid or ingested material. There is a small hiatal hernia. 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 There is fat stranding and inflammatory changes adjacent to the distal duodenum, for example series 2, image 51. remainder of the small bowel is decompressed. No obstruction. The appendix is not definitively visualized on the current exam. Mild low-density wall thickening of the ascending colon. Remainder of the colon is decompressed and not  well assessed. Vascular/Lymphatic: Normal caliber abdominal aorta with mild atherosclerosis. Left upper quadrant collaterals with varices coursing into the left lateral abdomen. Recannulated umbilical vein. There is no portal venous or mesenteric gas. Multiple prominent gastrohepatic and retroperitoneal nodes are likely reactive. There are few prominent bilateral inguinal nodes. Reproductive: Prostate is unremarkable. Other: Small volume abdominopelvic ascites with free fluid in the upper quadrants, both pericolic gutters, and in the pelvis. No free air or focal fluid collection. Right inguinal hernia contains fat and small volume ascites. Small left inguinal hernia contains fat. There is a small umbilical hernia with recannulized umbilical vein. Mild generalize subcutaneous edema. Musculoskeletal: Diffuse degenerative change in the spine. Modic endplate  changes at T11-T12. No focal bone lesion. IMPRESSION: 1. Fat stranding and inflammatory changes adjacent to the distal duodenum and uncinate process of the pancreas, favored to be duodenal in origin rather than pancreatic. Peptic ulcer disease or duodenitis are considered. Recommend correlation with pancreatic enzymes. 2. Hepatic cirrhosis with portal hypertension. Splenomegaly and portosystemic collaterals. Small volume abdominopelvic ascites. 3. Trace left pleural effusion. 4. Mild low-density wall thickening of the ascending colon, may be secondary to portal hypertension or colitis. 5. Bilateral inguinal hernias, right greater than left, containing fat and small volume ascites. Small umbilical hernia with recannulized umbilical vein. 6. Hiatal hernia as well as fat and small volume ascites extending through the diaphragmatic hiatus. Aortic Atherosclerosis (ICD10-I70.0). Electronically Signed   By: Narda RutherfordMelanie  Sanford M.D.   On: 04/02/2021 23:14   DG Chest Portable 1 View  Result Date: 04/02/2021 CLINICAL DATA:  Cough. Patient reports he has been able to urinate  for 5 days. Lower extremity edema. Shortness of breath. Abdominal pain. EXAM: PORTABLE CHEST 1 VIEW COMPARISON:  Radiograph 11/14/2015. FINDINGS: The heart is enlarged. There is vascular congestion. No focal airspace disease. No pleural fluid or pneumothorax. No acute osseous abnormalities are seen. IMPRESSION: Cardiomegaly with vascular congestion. Electronically Signed   By: Narda RutherfordMelanie  Sanford M.D.   On: 04/02/2021 21:58      Assessment/Plan  Acute decompensated liver failure Hyperbilirubinemia Supratherapeutic INR/Thrombocytopenia-no overt bleeding -Patient with heavy alcohol use presenting with jaundice and ascites with total bilirubin of 22, INR of 2.6, AST of 147, ALT of 64 -GI Dr. Servando SnareWohl spoke with EDP and recommended starting daily steroids and will see in consultation.  Discussed that patient is not a hepatic transplant candidate due to ongoing alcohol use. -Rocephin for SBP prophylaxis -Obtain paracentesis with fluid studies  Acute renal failure/hepatorenal syndrome/metabolic acidosis -Creatinine undetectable on admission due to hyperbilirubinemia -Give IV albumin and gentle IV fluid  -avoid nephrotoxic agent  -Monitor strict intake and output  Acute pancreatitis - CT abdominal and pelvis showed inflammation more likely duodenitis/peptic ulcer disease but lipase was greater than 300.   -Will start with IV albumin and gentle IV fluids.  Unable to give aggressive hydration due to his tenuous fluid status with ascites and decompensated liver failure  Hypotension -Patient has significant third spacing but is intravascularly depleted -Give albumin and gentle IV fluid hydration 50 cc/h for 10 hours -Hold all home antihypertensives  Hyponatremia Likely secondary to intravascular hypovolemia and beer [place] mania with alcohol abuse Follow with gentle IV hydration  Hypokalemia Repleted with oral supplementation  Questionable UTI - Patient had In-N-Out cath with UA negative for  leukocyte, negative nitrite but many bacteria -He is already on IV Rocephin for SBP prophylaxis.  Awaiting urine culture  Alcohol/opioid abuse Start on CIWA protocol  DVT prophylaxis:SCDs Code Status: Full Family Communication: Plan discussed with patient at bedside  disposition Plan: Home with at least 2 midnight stays  Consults called:  Admission status: inpatient  Level of care: Stepdown  Status is: Inpatient- pt with multi system organ failure with a guarded prognosis  Remains inpatient appropriate because:Inpatient level of care appropriate due to severity of illness  Dispo: The patient is from: Home              Anticipated d/c is to: Home              Patient currently is not medically stable to d/c.   Difficult to place patient No         Gretel Acrehing  T Leah Thornberry DO [facility]   If 7PM-7AM, please contact night-coverage [email or web address]   04/03/2021, 12:43 AM

## 2021-04-04 DIAGNOSIS — K7011 Alcoholic hepatitis with ascites: Secondary | ICD-10-CM

## 2021-04-04 DIAGNOSIS — R188 Other ascites: Secondary | ICD-10-CM

## 2021-04-04 DIAGNOSIS — K7200 Acute and subacute hepatic failure without coma: Secondary | ICD-10-CM | POA: Diagnosis not present

## 2021-04-04 LAB — CBC WITH DIFFERENTIAL/PLATELET
Abs Immature Granulocytes: 0.03 10*3/uL (ref 0.00–0.07)
Basophils Absolute: 0 10*3/uL (ref 0.0–0.1)
Basophils Relative: 0 %
Eosinophils Absolute: 0.1 10*3/uL (ref 0.0–0.5)
Eosinophils Relative: 2 %
HCT: 36.6 % — ABNORMAL LOW (ref 39.0–52.0)
Hemoglobin: 13.7 g/dL (ref 13.0–17.0)
Immature Granulocytes: 1 %
Lymphocytes Relative: 11 %
Lymphs Abs: 0.5 10*3/uL — ABNORMAL LOW (ref 0.7–4.0)
MCH: 38.2 pg — ABNORMAL HIGH (ref 26.0–34.0)
MCHC: 37.4 g/dL — ABNORMAL HIGH (ref 30.0–36.0)
MCV: 101.9 fL — ABNORMAL HIGH (ref 80.0–100.0)
Monocytes Absolute: 0.6 10*3/uL (ref 0.1–1.0)
Monocytes Relative: 12 %
Neutro Abs: 3.7 10*3/uL (ref 1.7–7.7)
Neutrophils Relative %: 74 %
Platelets: 55 10*3/uL — ABNORMAL LOW (ref 150–400)
RBC: 3.59 MIL/uL — ABNORMAL LOW (ref 4.22–5.81)
RDW: 18.1 % — ABNORMAL HIGH (ref 11.5–15.5)
WBC: 4.9 10*3/uL (ref 4.0–10.5)
nRBC: 0 % (ref 0.0–0.2)

## 2021-04-04 LAB — HEPATIC FUNCTION PANEL
ALT: 54 U/L — ABNORMAL HIGH (ref 0–44)
AST: 112 U/L — ABNORMAL HIGH (ref 15–41)
Albumin: 1.8 g/dL — ABNORMAL LOW (ref 3.5–5.0)
Alkaline Phosphatase: 64 U/L (ref 38–126)
Bilirubin, Direct: 17 mg/dL — ABNORMAL HIGH (ref 0.0–0.2)
Indirect Bilirubin: 12.5 mg/dL — ABNORMAL HIGH (ref 0.3–0.9)
Total Bilirubin: 29.5 mg/dL (ref 0.3–1.2)
Total Protein: 5.8 g/dL — ABNORMAL LOW (ref 6.5–8.1)

## 2021-04-04 LAB — BASIC METABOLIC PANEL
Anion gap: 11 (ref 5–15)
BUN: 55 mg/dL — ABNORMAL HIGH (ref 6–20)
CO2: 25 mmol/L (ref 22–32)
Calcium: 8 mg/dL — ABNORMAL LOW (ref 8.9–10.3)
Chloride: 92 mmol/L — ABNORMAL LOW (ref 98–111)
Creatinine, Ser: UNDETERMINED mg/dL (ref 0.61–1.24)
Glucose, Bld: 107 mg/dL — ABNORMAL HIGH (ref 70–99)
Potassium: 3.9 mmol/L (ref 3.5–5.1)
Sodium: 128 mmol/L — ABNORMAL LOW (ref 135–145)

## 2021-04-04 LAB — MAGNESIUM: Magnesium: 2.2 mg/dL (ref 1.7–2.4)

## 2021-04-04 LAB — PROTIME-INR
INR: 2.6 — ABNORMAL HIGH (ref 0.8–1.2)
Prothrombin Time: 28.1 seconds — ABNORMAL HIGH (ref 11.4–15.2)

## 2021-04-04 LAB — PHOSPHORUS: Phosphorus: UNDETERMINED mg/dL (ref 2.5–4.6)

## 2021-04-04 MED ORDER — LACTULOSE 10 GM/15ML PO SOLN
20.0000 g | Freq: Two times a day (BID) | ORAL | Status: DC
  Administered 2021-04-04 – 2021-04-05 (×2): 20 g via ORAL
  Filled 2021-04-04 (×2): qty 30

## 2021-04-04 NOTE — Progress Notes (Signed)
PROGRESS NOTE  CHRSITOPHER Poole  DOB: January 27, 1961  PCP: Mendel Ryder, MD TTS:[id number]  DOA: 04/02/2021  LOS: 1 day  Hospital Day: 3   Chief Complaint  Patient presents with   Abdominal Pain    Brief narrative: Oscar Poole is a 60 y.o. male with PMH significant for chronic hepatitis C, history of alcohol and opiate use on Suboxone, hypertension. Patient presented to the ED on 8/12 with complaint of worsening abdominal pain, constipation and decreased urine output. Abdominal pain diffuse for 4 to 5 days associated with nausea vomiting, no bowel movement in 4 to 5 days.  Because of constipation for the duration, he stopped Suboxone.  No urine output in 4 to 5 days. Reports drinking about 5-6 cocktails daily but is unable to do so for the last 5 days because of which he started having tremors.  In the ED, patient was afebrile, blood pressure was initially elevated 278/133 but later on dipped down to 89/62 in few hours Labs with significant abnormality including sodium low at 126, potassium low at 2.8, lipase elevated to 342, AST/ALT/alk phos at 147/64/108, total bilirubin elevated to 22 with direct component significantly elevated to 18, creatinine was unable to calculate because of high level of bilirubin CBC with hemoglobin 15, MCV 102, platelet 97, INR 2.6 Tylenol level less than 10.1.  CT abdomen pelvis showed concern of duodenitis, evidence of hepatic cirrhosis with portal hypertension, small volume ascites.  CBD not well defined but no evidence of biliary dilatation. Admitted to hospitalist service GI was consulted. See below for details  Subjective: Patient was seen and examined this morning.  Lying on bed.  Under the effect of pain medicine he was given earlier.  Could not sleep well last night.  Minimal urine output in Foley catheter.  Assessment/Plan: Acute pancreatitis/duodenitis -Presented with progressively worsening abdominal pain in the setting of heavy alcohol  use -Lipase level elevated, liver enzymes elevated -CT abdomen findings are more suggestive of duodenitis and pancreatitis. 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  Repeat labs tomorrow. -IV Rocephin for SBP prophylaxis -Currently on oral prednisone per GI recommendation. -Not sure if he has enough ascites for paracentesis as the CT scan mentions only small volume ascites. Recent Labs  Lab 04/02/21 2108 04/03/21 1052 04/04/21 0749  AST 147* 133* 112*  ALT 64* 63* 54*  ALKPHOS 108 77 64  BILITOT 22.2* 35.1* 29.5*  PROT 7.9 7.4 5.8*  ALBUMIN 2.1* 2.4* 1.8*   Recent Labs  Lab 04/02/21 2151 04/03/21 1052 04/04/21 0749  INR 2.6* 2.7* 2.6*   Acute kidney injury -Creatinine is unable to assess because of high bilirubin level.  But clinically patient is making minimal urine output.  High risk of hepatorenal syndrome.  Currently on LR at 100 mill per hour which I will continue. Recent Labs    04/02/21 2108 04/03/21 1052 04/04/21 0411  BUN 42* 46* 55*  CREATININE UNABLE TO REPORT DUE TO ICTERUS DLB UNABLE TO REPORT DUE TO ICTERUS UNABLE TO REPORT DUE TO ICTERUS   Essential hypertension -Blood pressure in low normal range.  Currently Home meds on hold including amlodipine, HCTZ and lisinopril.  Continue IV fluid. -  Because of coexisting portal hypertension, patient may benefit from initiation of beta-blocker and diuretics prior to discharge if clinically improves    Hyponatremia -Likely hypervolemic hyponatremia related to portal hypertension, probably also contributed by low solute intake in last 4 to 5  days. -Currently on LR at 100 mill per hour. Recent Labs  Lab 04/02/21 2108 04/03/21 1052 04/04/21 0411  NA 126* 127* 128*   Hypokalemia -Potassium level significantly low at 2.8 on admission.  40 mEq oral potassium was given.  Potassium level shows improvement.   Recent Labs  Lab 04/02/21 2108 04/03/21 1052 04/04/21 0411  K 2.8* 4.0 3.9  MG 2.2  --  2.2  PHOS  --   --  UNABLE TO REPORT DUE TO ICTERUS   Chronic alcoholism -On CIWA protocol with as needed Ativan  Chronic opioid use -On Suboxone which he has not been taking for last 5 days.  Watch out for opiate withdrawal.  Mobility: PT eval Code Status:   Code Status: Full Code  Nutritional status: Body mass index is 44.25 kg/m.     Diet:  Diet Order             Diet Heart Room service appropriate? Yes; Fluid consistency: Thin  Diet effective now                  DVT prophylaxis:  SCDs Start: 04/03/21 0032   Antimicrobials: IV Rocephin Fluid: LR$RemoveBeforeDEI'@100'LLgLxgxRHXCMAGOF$  mill per hour Consultants: GI, palliative care consulted Family Communication: Called and updated patient's fianc Ms. Mariann Laster 8/13.  Status is: Inpatient  Remains inpatient appropriate because: Critically ill  Dispo: The patient is from: Home              Anticipated d/c is to: Depends on clinical improvement.  High likelihood of poor prognosis              Patient currently is not medically stable to d/c.   Difficult to place patient No     Infusions:   cefTRIAXone (ROCEPHIN)  IV     lactated ringers 100 mL/hr at 04/04/21 0093    Scheduled Meds:  Chlorhexidine Gluconate Cloth  6 each Topical Daily   folic acid  1 mg Oral Daily   multivitamin with minerals  1 tablet Oral Daily   mupirocin ointment   Nasal BID   pantoprazole  40 mg Oral BID AC   predniSONE  40 mg Oral Q breakfast   thiamine  100 mg Oral Daily   Or   thiamine  100 mg Intravenous Daily    Antimicrobials: Anti-infectives (From admission, onward)    Start     Dose/Rate Route  Frequency Ordered Stop   04/04/21 2300  cefTRIAXone (ROCEPHIN) 1 g in sodium chloride 0.9 % 100 mL IVPB        1 g 200 mL/hr over 30 Minutes Intravenous Every 24 hours 04/03/21 0035     04/02/21 2315  cefTRIAXone (ROCEPHIN) 1 g in sodium chloride 0.9 % 100 mL IVPB        1 g 200 mL/hr over 30 Minutes Intravenous  Once 04/02/21 2313 04/02/21 2348       PRN meds: HYDROmorphone (DILAUDID) injection, LORazepam **OR** LORazepam   Objective: Vitals:   04/04/21 1000 04/04/21 1200  BP: 110/68 106/66  Pulse: 80 81  Resp: 16 18  Temp:    SpO2: 97% 96%    Intake/Output Summary (Last 24 hours) at 04/04/2021 1345 Last data filed at 04/04/2021 1000 Gross per  24 hour  Intake 2181.77 ml  Output 330 ml  Net 1851.77 ml   Filed Weights   04/02/21 2102  Weight: (!) 148 kg   Weight change:  Body mass index is 44.25 kg/m.   Physical Exam: General exam: Pleasant, middle-aged morbidly obese Caucasian male.  Minimal urine output in Foley catheter Skin: No rashes, lesions or ulcers. HEENT: Atraumatic, normocephalic, no obvious bleeding Lungs: Diminished air entry at both bases CVS: Regular rate and rhythm, no murmur GI/Abd soft, distended from obesity and ascites.  Mild tenderness in the epigastrium CNS: Sleeping, able to open eyes on command. Psychiatry: Depressed look Extremities: No pedal edema, no calf tenderness  Data Review: I have personally reviewed the laboratory data and studies available.  Recent Labs  Lab 04/02/21 2108 04/03/21 1052 04/04/21 0411  WBC 5.2 5.5 4.9  NEUTROABS  --   --  3.7  HGB 15.1 14.3 13.7  HCT 41.4 39.3 36.6*  MCV 101.7* 101.8* 101.9*  PLT 97* 54* 55*   Recent Labs  Lab 04/02/21 2108 04/03/21 1052 04/04/21 0411  NA 126* 127* 128*  K 2.8* 4.0 3.9  CL 88* 90* 92*  CO2 21* 22 25  GLUCOSE 132* 121* 107*  BUN 42* 46* 55*  CREATININE UNABLE TO REPORT DUE TO ICTERUS DLB UNABLE TO REPORT DUE TO ICTERUS UNABLE TO REPORT DUE TO ICTERUS  CALCIUM  8.4* 8.2* 8.0*  MG 2.2  --  2.2  PHOS  --   --  UNABLE TO REPORT DUE TO ICTERUS    F/u labs ordered Unresulted Labs (From admission, onward)     Start     Ordered   04/05/21 0500  Lipase, blood  Daily,   R      04/04/21 1345   04/05/21 0500  CBC with Differential/Platelet  Daily,   R      04/04/21 1345   04/05/21 0500  Comprehensive metabolic panel  Daily,   R      04/04/21 1345            Signed, Terrilee Croak, MD [facility] 04/04/2021

## 2021-04-04 NOTE — Progress Notes (Signed)
Oscar Minium, MD [facility]   [address], [address] [place], [place] [address] Phone: [phone] Fax : [phone]   Subjective: The patient still appears quite ill and continues to report that he is done with drinking.  The patient's abdominal discomfort is reported to be better.  The patient's INR has stabilized and has remained around the same level without returning to normal.  His bilirubin has come down slightly but is still significantly elevated.   Objective: Vital signs in last 24 hours: Vitals:   04/04/21 0800 04/04/21 1000 04/04/21 1200 04/04/21 1400  BP: 113/62 110/68 106/66 105/70  Pulse: 83 80 81 78  Resp: 18 16 18    Temp: (!) 97.4 F (36.3 C)  98.5 F (36.9 C)   TempSrc: Oral  Oral   SpO2: 97% 97% 96% 95%  Weight:      Height:       Weight change:   Intake/Output Summary (Last 24 hours) at 04/04/2021 1607 Last data filed at 04/04/2021 1400 Gross per 24 hour  Intake 2188.09 ml  Output 340 ml  Net 1848.09 ml     Exam: Heart:: Regular rate and rhythm, S1S2 present, or without murmur or extra heart sounds Lungs: normal and clear to auscultation and percussion Abdomen: soft, nontender, normal bowel sounds   Lab Results: @LABTEST2 @ Micro Results: Recent Results (from the past 240 hour(s))  Urine Culture     Status: Abnormal (Preliminary result)   Collection Time: 04/02/21  9:07 PM   Specimen: Urine, Random  Result Value Ref Range Status   Specimen Description   Final    URINE, RANDOM Performed at [facility], [address], [place], [address] [place]    Special Requests   Final    NONE Performed at [facility], [address] [place], [address] [place]    Culture 80,000 COLONIES/mL STAPHYLOCOCCUS EPIDERMIDIS (A)  Final   Report Status PENDING  Incomplete  SARS CORONAVIRUS 2 (TAT 6-24 HRS) Nasopharyngeal Nasopharyngeal Swab     Status: None   Collection Time: 04/02/21  9:51 PM   Specimen: Nasopharyngeal Swab  Result Value Ref  Range Status   SARS Coronavirus 2 NEGATIVE NEGATIVE Final    Comment: (NOTE) SARS-CoV-2 target nucleic acids are NOT DETECTED.  The SARS-CoV-2 RNA is generally detectable in upper and lower respiratory specimens during the acute phase of infection. Negative results do not preclude SARS-CoV-2 infection, do not rule out co-infections with other pathogens, and should not be used as the sole basis for treatment or other patient management decisions. Negative results must be combined with clinical observations, patient history, and epidemiological information. The expected result is Negative.  Fact Sheet for Patients: 40086  Fact Sheet for Healthcare Providers: 06/02/21  This test is not yet approved or cleared by the [employer] FDA and  has been authorized for detection and/or diagnosis of SARS-CoV-2 by FDA under an Emergency Use Authorization (EUA). This EUA will remain  in effect (meaning this test can be used) for the duration of the COVID-19 declaration under Se ction 564(b)(1) of the Act, 21 U.S.C. section 360bbb-3(b)(1), unless the authorization is terminated or revoked sooner.  Performed at [facility], [address], [place], [address] [place]   MRSA Next Gen by PCR, Nasal     Status: Abnormal   Collection Time: 04/03/21  6:37 AM   Specimen: Nasal Mucosa; Nasal Swab  Result Value Ref Range Status   MRSA by PCR Next Gen DETECTED (A) NOT DETECTED  Final    Comment: RESULT CALLED TO, READ BACK BY AND VERIFIED WITH: Fabian Sharp AT 5456 04/03/21.PMF (NOTE) The GeneXpert MRSA Assay (FDA approved for NASAL specimens only), is one component of a comprehensive MRSA colonization surveillance program. It is not intended to diagnose MRSA infection nor to guide or monitor treatment for MRSA infections. Test performance is not FDA approved in patients less than 28 years old. Performed at [facility], [address] [place], [place] [address]    Studies/Results: CT ABDOMEN PELVIS WO CONTRAST  Result Date: 04/02/2021 CLINICAL DATA:  Acute abdominal pain. Patient states he was unable to urinate for 5 days. Lower extremity edema. Vomiting and constipation. Jaundice. EXAM: CT ABDOMEN AND PELVIS WITHOUT CONTRAST TECHNIQUE: Multidetector CT imaging of the abdomen and pelvis was performed following the standard protocol without IV contrast. COMPARISON:  Remote abdominal CT 10/24/2010 FINDINGS: Lower chest: Trace left pleural effusion. Mild dependent atelectasis in both lower lobes. Heart is mildly enlarged. There is a hiatal hernia with herniation of fat and ascites through the diaphragmatic defect. Hepatobiliary: Diffusely decreased hepatic density typical of steatosis. The liver is enlarged spanning 20.7 cm cranial caudal. There is capsular nodularity. No obvious focal hepatic lesion on this unenhanced exam. Gallbladder is distended. No calcified gallstone. Common bile duct is not well-defined, however there is no biliary dilatation. Pancreas: Fatty atrophy of the pancreas. There is stranding adjacent to the duodenum and uncinate process of the pancreas, favored to be duodenal in origin rather than pancreatic. No pancreatic ductal dilatation. Spleen: Splenomegaly with spleen spanning 18 x 7.7 x 14.6 cm (volume = 1100 cm^3). No obvious focal abnormality on this unenhanced exam. Adrenals/Urinary Tract: No adrenal nodule. No hydronephrosis or renal calculi. Mild symmetric perinephric edema, nonspecific. No evidence of focal renal lesion. Urinary bladder is near completely empty. Stomach/Bowel: Bowel assessment is limited in the absence of enteric contrast and presence of ascites. Stomach mildly distended with fluid or ingested material. There is a small hiatal hernia. 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 There is fat stranding and inflammatory changes adjacent to the distal duodenum, for example  series 2, image 51. remainder of the small bowel is decompressed. No obstruction. The appendix is not definitively visualized on the current exam. Mild low-density wall thickening of the ascending colon. Remainder of the colon is decompressed and not well assessed. Vascular/Lymphatic: Normal caliber abdominal aorta with mild atherosclerosis. Left upper quadrant collaterals with varices coursing into the left lateral abdomen. Recannulated umbilical vein. There is no portal venous or mesenteric gas. Multiple prominent gastrohepatic and retroperitoneal nodes are likely reactive. There are few prominent bilateral inguinal nodes. Reproductive: Prostate is unremarkable. Other: Small volume abdominopelvic ascites with free fluid in the upper quadrants, both pericolic gutters, and in the pelvis. No free air or focal fluid collection. Right inguinal hernia contains fat and small volume ascites. Small left inguinal hernia contains fat. There is a small umbilical hernia with recannulized umbilical vein. Mild generalize subcutaneous edema. Musculoskeletal: Diffuse degenerative change in the spine. Modic endplate changes at T11-T12. No focal bone lesion. IMPRESSION: 1. Fat stranding and inflammatory changes adjacent to the distal duodenum and uncinate process of the pancreas, favored to be duodenal in origin rather than pancreatic. Peptic ulcer disease or duodenitis are considered. Recommend correlation with pancreatic enzymes. 2. Hepatic cirrhosis with portal hypertension. Splenomegaly and portosystemic collaterals. Small volume abdominopelvic ascites. 3. Trace left pleural effusion. 4. Mild low-density wall thickening of the ascending colon, may be secondary to portal hypertension or colitis. 5. Bilateral  inguinal hernias, right greater than left, containing fat and small volume ascites. Small umbilical hernia with recannulized umbilical vein. 6. Hiatal hernia as well as fat and small volume ascites extending through the  diaphragmatic hiatus. Aortic Atherosclerosis (ICD10-I70.0). Electronically Signed   By: Narda Rutherford M.D.   On: 04/02/2021 23:14   DG Chest Portable 1 View  Result Date: 04/02/2021 CLINICAL DATA:  Cough. Patient reports he has been able to urinate for 5 days. Lower extremity edema. Shortness of breath. Abdominal pain. EXAM: PORTABLE CHEST 1 VIEW COMPARISON:  Radiograph 11/14/2015. FINDINGS: The heart is enlarged. There is vascular congestion. No focal airspace disease. No pleural fluid or pneumothorax. No acute osseous abnormalities are seen. IMPRESSION: Cardiomegaly with vascular congestion. Electronically Signed   By: Narda Rutherford M.D.   On: 04/02/2021 21:58   Medications: I have reviewed the patient's current medications. Scheduled Meds:  Chlorhexidine Gluconate Cloth  6 each Topical Daily   folic acid  1 mg Oral Daily   lactulose  20 g Oral BID   multivitamin with minerals  1 tablet Oral Daily   mupirocin ointment   Nasal BID   pantoprazole  40 mg Oral BID AC   predniSONE  40 mg Oral Q breakfast   thiamine  100 mg Oral Daily   Or   thiamine  100 mg Intravenous Daily   Continuous Infusions:  cefTRIAXone (ROCEPHIN)  IV     lactated ringers 100 mL/hr at 04/04/21 1400   PRN Meds:.HYDROmorphone (DILAUDID) injection, LORazepam **OR** LORazepam   Assessment: Principal Problem:   Liver failure without hepatic coma (HCC) Active Problems:   Alcohol intoxication (HCC)   Hypokalemia   Acute renal failure (HCC)   Acute pancreatitis   Hypotension   Hyponatremia   Alcohol abuse    Plan: This patient has a history of significant alcohol abuse with cirrhosis and portal hypertension.  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 The patient's creatinine is again unable to be reported due to the jaundice.  The patient continues to have a poor prognosis despite improving labs.  I did contact palliative care medicine yesterday  but was informed that there is nobody on call for them during the weekend and somebody will come see the patient tomorrow.   LOS: 1 day   Sherlyn Hay 04/04/2021, 4:07 PM Pager [phone] 7am-5pm  Check AMION for 5pm -7am coverage and on weekends

## 2021-04-04 NOTE — Plan of Care (Signed)
Neuro: Consistently A&O x 2-self and location, increased confusion throughout the day, elevated CIWA-hallucinations-auditory/visual, unable to give ativan d/t vital signs, dilaudid tolerated for pain, MD notified-lactulose suggested addition to treatment with an ammonia recheck-ordered Resp: tolerating 2L [place] well CV: afebrile, intermittent hypotension vital signs otherwise stable, generalized edema GIGU: foley in place, smear BM, tolerating some PO/meds well Skin: significant jaundice, dry/flaky Social: brother visited this AM, fiance called for updates, son called for update and planning to visit tomorrow; all questions and concerns addressed  Problem: Education: Goal: Knowledge of General Education information will improve Description: Including pain rating scale, medication(s)/side effects and non-pharmacologic comfort measures Outcome: Not Progressing   Problem: Health Behavior/Discharge Planning: Goal: Ability to manage health-related needs will improve Outcome: Not Progressing   Problem: Clinical Measurements: Goal: Ability to maintain clinical measurements within normal limits will improve Outcome: Not Progressing Goal: Will remain free from infection Outcome: Not Progressing Goal: Diagnostic test results will improve Outcome: Not Progressing Goal: Respiratory complications will improve Outcome: Not Progressing Goal: Cardiovascular complication will be avoided Outcome: Not Progressing   Problem: Activity: Goal: Risk for activity intolerance will decrease Outcome: Not Progressing   Problem: Nutrition: Goal: Adequate nutrition will be maintained Outcome: Not Progressing   Problem: Coping: Goal: Level of anxiety will decrease Outcome: Not Progressing   Problem: Elimination: Goal: Will not experience complications related to bowel motility Outcome: Not Progressing Goal: Will not experience complications related to urinary retention Outcome: Not Progressing   Problem:  Pain Managment: Goal: General experience of comfort will improve Outcome: Not Progressing   Problem: Safety: Goal: Ability to remain free from injury will improve Outcome: Not Progressing   Problem: Skin Integrity: Goal: Risk for impaired skin integrity will decrease Outcome: Not Progressing

## 2021-04-05 ENCOUNTER — Inpatient Hospital Stay: Payer: Medicaid Other

## 2021-04-05 DIAGNOSIS — K5900 Constipation, unspecified: Secondary | ICD-10-CM

## 2021-04-05 DIAGNOSIS — K7200 Acute and subacute hepatic failure without coma: Secondary | ICD-10-CM

## 2021-04-05 DIAGNOSIS — F1010 Alcohol abuse, uncomplicated: Secondary | ICD-10-CM

## 2021-04-05 LAB — AMMONIA: Ammonia: 105 umol/L — ABNORMAL HIGH (ref 9–35)

## 2021-04-05 LAB — CBC WITH DIFFERENTIAL/PLATELET
Abs Immature Granulocytes: 0.06 10*3/uL (ref 0.00–0.07)
Basophils Absolute: 0 10*3/uL (ref 0.0–0.1)
Basophils Relative: 0 %
Eosinophils Absolute: 0 10*3/uL (ref 0.0–0.5)
Eosinophils Relative: 1 %
HCT: 35.6 % — ABNORMAL LOW (ref 39.0–52.0)
Hemoglobin: 13.1 g/dL (ref 13.0–17.0)
Immature Granulocytes: 1 %
Lymphocytes Relative: 8 %
Lymphs Abs: 0.6 10*3/uL — ABNORMAL LOW (ref 0.7–4.0)
MCH: 37.1 pg — ABNORMAL HIGH (ref 26.0–34.0)
MCHC: 36.8 g/dL — ABNORMAL HIGH (ref 30.0–36.0)
MCV: 100.8 fL — ABNORMAL HIGH (ref 80.0–100.0)
Monocytes Absolute: 0.5 10*3/uL (ref 0.1–1.0)
Monocytes Relative: 8 %
Neutro Abs: 5.6 10*3/uL (ref 1.7–7.7)
Neutrophils Relative %: 82 %
Platelets: 64 10*3/uL — ABNORMAL LOW (ref 150–400)
RBC: 3.53 MIL/uL — ABNORMAL LOW (ref 4.22–5.81)
RDW: 17.3 % — ABNORMAL HIGH (ref 11.5–15.5)
Smear Review: NORMAL
WBC: 6.8 10*3/uL (ref 4.0–10.5)
nRBC: 0 % (ref 0.0–0.2)

## 2021-04-05 LAB — COMPREHENSIVE METABOLIC PANEL
ALT: 54 U/L — ABNORMAL HIGH (ref 0–44)
AST: 115 U/L — ABNORMAL HIGH (ref 15–41)
Albumin: 1.8 g/dL — ABNORMAL LOW (ref 3.5–5.0)
Alkaline Phosphatase: 71 U/L (ref 38–126)
Anion gap: 8 (ref 5–15)
BUN: 75 mg/dL — ABNORMAL HIGH (ref 6–20)
CO2: 25 mmol/L (ref 22–32)
Calcium: 7.6 mg/dL — ABNORMAL LOW (ref 8.9–10.3)
Chloride: 92 mmol/L — ABNORMAL LOW (ref 98–111)
Creatinine, Ser: UNDETERMINED mg/dL (ref 0.61–1.24)
Glucose, Bld: 121 mg/dL — ABNORMAL HIGH (ref 70–99)
Potassium: 3.9 mmol/L (ref 3.5–5.1)
Sodium: 125 mmol/L — ABNORMAL LOW (ref 135–145)
Total Bilirubin: 29.4 mg/dL (ref 0.3–1.2)
Total Protein: 6.5 g/dL (ref 6.5–8.1)

## 2021-04-05 LAB — LIPASE, BLOOD: Lipase: 150 U/L — ABNORMAL HIGH (ref 11–51)

## 2021-04-05 LAB — URINE CULTURE: Culture: 80000 — AB

## 2021-04-05 LAB — HEPATITIS B SURFACE ANTIGEN: Hepatitis B Surface Ag: NONREACTIVE

## 2021-04-05 LAB — GLUCOSE, CAPILLARY: Glucose-Capillary: 102 mg/dL — ABNORMAL HIGH (ref 70–99)

## 2021-04-05 LAB — HEPATITIS A ANTIBODY, IGM: Hep A IgM: NONREACTIVE

## 2021-04-05 MED ORDER — LACTULOSE 10 GM/15ML PO SOLN
30.0000 g | Freq: Three times a day (TID) | ORAL | Status: DC
  Administered 2021-04-05: 30 g via ORAL
  Filled 2021-04-05: qty 60

## 2021-04-05 MED ORDER — LACTULOSE 10 GM/15ML PO SOLN
30.0000 g | Freq: Four times a day (QID) | ORAL | Status: DC
  Administered 2021-04-05 – 2021-04-07 (×5): 30 g via ORAL
  Filled 2021-04-05 (×5): qty 60

## 2021-04-05 NOTE — TOC Progression Note (Signed)
Transition of Care [facility]) - Progression Note    Patient Details  Name: Oscar Poole MRN: [id number] Date of Birth: 1960/11/28  Transition [facility] CM/SW Contact  Marina Goodell Phone Number: [phone] 04/05/2021, 12:20 PM  Clinical Narrative:     CSW spoke with patient's daughter Oscar Poole [phone], and patient's son Oscar Poole [phone]. Patient also participated during the conversation and was oriented to self and situation, and had recall when asked questions. CSW explained the role of TOC in patient care.  CSW spoke about discharge and different options available depending on patent's care needs.  Patient stated he would like for his Oscar Poole [phone] ans his son Oscar Poole, to be primary contacts and decisions makers. Patient is legally separated to Oscar Poole and does not want her to be a primary decision maker or main contact.  CSW spoke with patient and family about HCPOA and Advanced Directives and recommended to have the Chaplain come and speak with the patient about ADs.  Patient and family verbalized agreement and stated they were also amenable to speaking with Palliative NP for goals of care conversation.  Family and patient also asked about DME and patient possibly needing a hospital bed when he returns home.  CSW stated TOC will assist with getting recommended DME once the patient was closer to discharge and we had a recommendation of discharge plans. CSW encouraged family and patient to contact me if they has any questions, they verbalized understanding.   Expected Discharge Plan: Home w Home Health Services Barriers to Discharge: Continued Medical Work up  Expected Discharge Plan and Services Expected Discharge Plan: Home w Home Health Services In-house Referral: Clinical Social Work, Hospice / Palliative Care   Post Acute Care Choice: Home Health Living arrangements for the past 2 months: Mobile Home                                        Social Determinants of Health (SDOH) Interventions    Readmission Risk Interventions No flowsheet data found.

## 2021-04-05 NOTE — Progress Notes (Signed)
PROGRESS NOTE  Oscar Poole  DOB: Dec 12, 1960  PCP: Mendel Ryder, MD OBS:[id number]  DOA: 04/02/2021  LOS: 2 days  Hospital Day: 4   Chief Complaint  Patient presents with   Abdominal Pain    Brief narrative: Oscar Poole is a 60 y.o. male with PMH significant for chronic hepatitis C, history of alcohol and opiate use on Suboxone, hypertension. Patient presented to the ED on 8/12 with complaint of worsening abdominal pain, constipation and decreased urine output. Abdominal pain diffuse for 4 to 5 days associated with nausea vomiting, no bowel movement in 4 to 5 days.  Because of constipation for the duration, he stopped Suboxone.  No urine output in 4 to 5 days. Reports drinking about 5-6 cocktails daily but is unable to do so for the last 5 days because of which he started having tremors.  In the ED, patient was afebrile, blood pressure was initially elevated 278/133 but later on dipped down to 89/62 in few hours Labs with significant abnormality including sodium low at 126, potassium low at 2.8, lipase elevated to 342, AST/ALT/alk phos at 147/64/108, total bilirubin elevated to 22 with direct component significantly elevated to 18, creatinine was unable to calculate because of high level of bilirubin CBC with hemoglobin 15, MCV 102, platelet 97, INR 2.6 Tylenol level less than 10.1.  CT abdomen pelvis showed concern of duodenitis, evidence of hepatic cirrhosis with portal hypertension, small volume ascites.  CBD not well defined but no evidence of biliary dilatation. Admitted to hospitalist service GI was consulted. See below for details  Subjective: Patient was seen and examined this morning.  Drowsy, opens eyes on verbal command, able to have simple conversation.  He was seen later again in the day.  His son and daughter were at bedside. Patient is not clear about his advanced wishes on resuscitation.  His children understand the gravity of the situation and his poor  prognosis.  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 -GI consult appreciated.  Currently on IV Rocephin for SBP prophylaxis and oral prednisone for alcoholic hepatitis. -Not enough ascites to tap. -Ammonia level elevated to 105 today.  On lactulose 30 mg every 6 hours. Recent Labs  Lab 04/02/21 2108 04/03/21 1052 04/04/21 0749 04/05/21 0544  AST 147* 133* 112* 115*  ALT 64* 63* 54* 54*  ALKPHOS 108 77 64 71  BILITOT 22.2* 35.1* 29.5* 29.4*  PROT 7.9 7.4 5.8* 6.5  ALBUMIN 2.1* 2.4* 1.8* 1.8*   Recent Labs  Lab 04/02/21 2151 04/03/21 1052 04/04/21 0749  INR 2.6* 2.7* 2.6*   Recent Labs  Lab 04/02/21 2108 04/05/21 0544  AMMONIA 50* 105*    Acute pancreatitis/duodenitis -Presented with progressively worsening abdominal pain in the setting of heavy alcohol use -Lipase level elevated, liver enzymes elevated -CT abdomen findings are more suggestive of duodenitis and pancreatitis. -Patient was started on conservative management with IV fluid, IV albumin, pain medicine, antiemetics and PPI -Repeat lipase tomorrow Recent Labs  Lab 04/02/21 2108 04/05/21 0544  LIPASE 342* 150*    Acute kidney injury -Creatinine is unable to assess because of high bilirubin level.  But clinically patient is making minimal urine output.  High risk of hepatorenal syndrome.  Currently on LR at 100 mill per hour which I will continue. Recent Labs    04/02/21 2108 04/03/21 1052 04/04/21 0411 04/05/21 0544  BUN 42* 46*  55* 75*  CREATININE UNABLE TO REPORT DUE TO ICTERUS DLB UNABLE TO REPORT DUE TO ICTERUS UNABLE TO REPORT DUE TO ICTERUS UNABLE TO REPORT DUE TO ICTERUS   Intake/Output Summary (Last 24 hours) at 04/05/2021 1707 Last data filed at 04/05/2021 1659 Gross per 24 hour  Intake 2140.31 ml  Output 750 ml  Net 1390.31 ml   Essential  hypertension -Blood pressure in low normal range.  Currently Home meds on hold including amlodipine, HCTZ and lisinopril.  Continue IV fluid. -Because of coexisting portal hypertension, patient may benefit from initiation of beta-blocker and diuretics prior to discharge if clinically improves    Hyponatremia -Likely hypervolemic hyponatremia related to portal hypertension, probably also contributed by low solute intake in last 4 to 5 days. -Continues to have worsening hyponatremia. Recent Labs  Lab 04/02/21 2108 04/03/21 1052 04/04/21 0411 04/05/21 0544  NA 126* 127* 128* 125*   Hypokalemia -Potassium level significantly low at 2.8 on admission.  40 mEq oral potassium was given.  Potassium level shows improvement.   Recent Labs  Lab 04/02/21 2108 04/03/21 1052 04/04/21 0411 04/05/21 0544  K 2.8* 4.0 3.9 3.9  MG 2.2  --  2.2  --   PHOS  --   --  UNABLE TO REPORT DUE TO ICTERUS  --    Chronic alcoholism -On CIWA protocol with as needed Ativan.  We will minimize use of Ativan to avoid further encephalopathy  Chronic opioid use -On Suboxone which he has not been taking for last 5 days.  Watch out for opiate withdrawal.  Mobility: PT eval if patient clinically improved Code Status:   Code Status: Full Code  Nutritional status: Body mass index is 44.25 kg/m.     Diet:  Diet Order             Diet Heart Room service appropriate? Yes; Fluid consistency: Thin  Diet effective now                  DVT prophylaxis:  SCDs Start: 04/03/21 0032   Antimicrobials: IV Rocephin Fluid: LR_0  mill per hour Consultants: GI, palliative care consulted Family Communication: Discussed with patient's son and daughter at bedside.  Status is: Inpatient  Remains inpatient appropriate because: Critically ill  Dispo: The patient is from: Home              Anticipated d/c is to: Depends on clinical improvement.  High likelihood of poor prognosis              Patient currently is not  medically stable to d/c.   Difficult to place patient No     Infusions:   cefTRIAXone (ROCEPHIN)  IV 1 g (04/04/21 2309)   lactated ringers 100 mL/hr at 04/05/21 1620    Scheduled Meds:  Chlorhexidine Gluconate Cloth  6 each Topical Daily   folic acid  1 mg Oral Daily   lactulose  30 g Oral QID   multivitamin with minerals  1 tablet Oral Daily   mupirocin ointment   Nasal BID   pantoprazole  40 mg Oral BID AC   predniSONE  40 mg Oral Q breakfast   thiamine  100 mg Oral Daily   Or   thiamine  100 mg Intravenous Daily    Antimicrobials: Anti-infectives (From admission, onward)    Start     Dose/Rate Route Frequency Ordered Stop   04/04/21 2300  cefTRIAXone (ROCEPHIN) 1 g in sodium chloride 0.9 % 100 mL IVPB  1 g 200 mL/hr over 30 Minutes Intravenous Every 24 hours 04/03/21 0035     04/02/21 2315  cefTRIAXone (ROCEPHIN) 1 g in sodium chloride 0.9 % 100 mL IVPB        1 g 200 mL/hr over 30 Minutes Intravenous  Once 04/02/21 2313 04/02/21 2348       PRN meds: HYDROmorphone (DILAUDID) injection, LORazepam **OR** LORazepam   Objective: Vitals:   04/05/21 0430 04/05/21 0502  BP: 106/69 112/70  Pulse: 75 76  Resp: 20   Temp:    SpO2: 95%     Intake/Output Summary (Last 24 hours) at 04/05/2021 1707 Last data filed at 04/05/2021 1659 Gross per 24 hour  Intake 2140.31 ml  Output 750 ml  Net 1390.31 ml   Filed Weights   04/02/21 2102  Weight: (!) 148 kg   Weight change:  Body mass index is 44.25 kg/m.   Physical Exam: General exam: Pleasant, middle-aged morbidly obese Caucasian male.  Complains of abdominal pain when awake HEENT: Atraumatic, normocephalic, no obvious bleeding Lungs: Diminished air entry at both bases CVS: Regular rate and rhythm, no murmur GI/Abd soft, mild diffuse tenderness present, distended from obesity and ascites  CNS: Sleeping, able to open eyes on command. Psychiatry: Depressed look Extremities: No pedal edema, no calf  tenderness  Data Review: I have personally reviewed the laboratory data and studies available.  Recent Labs  Lab 04/02/21 2108 04/03/21 1052 04/04/21 0411 04/05/21 0544  WBC 5.2 5.5 4.9 6.8  NEUTROABS  --   --  3.7 5.6  HGB 15.1 14.3 13.7 13.1  HCT 41.4 39.3 36.6* 35.6*  MCV 101.7* 101.8* 101.9* 100.8*  PLT 97* 54* 55* 64*   Recent Labs  Lab 04/02/21 2108 04/03/21 1052 04/04/21 0411 04/05/21 0544  NA 126* 127* 128* 125*  K 2.8* 4.0 3.9 3.9  CL 88* 90* 92* 92*  CO2 21* _0 GLUCOSE 132* 121* 107* 121*  BUN 42* 46* 55* 75*  CREATININE UNABLE TO REPORT DUE TO ICTERUS DLB UNABLE TO REPORT DUE TO ICTERUS UNABLE TO REPORT DUE TO ICTERUS UNABLE TO REPORT DUE TO ICTERUS  CALCIUM 8.4* 8.2* 8.0* 7.6*  MG 2.2  --  2.2  --   PHOS  --   --  UNABLE TO REPORT DUE TO ICTERUS  --     F/u labs ordered Unresulted Labs (From admission, onward)     Start     Ordered   04/06/21 1443  Protime-INR  Once,   R       Question:  Specimen collection method  Answer:  Lab=Lab collect   04/05/21 1443   04/05/21 1444  EBV ab to viral capsid ag pnl, IgG+IgM  Add-on,   AD       Question:  Specimen collection method  Answer:  Lab=Lab collect   04/05/21 1443   04/05/21 1443  HSV(herpes simplex vrs) 1+2 ab-IgM  Add-on,   AD       Question:  Specimen collection method  Answer:  Lab=Lab collect   04/05/21 1443   04/05/21 1443  CMV DNA, quantitative, PCR  Add-on,   AD       Question:  Specimen collection method  Answer:  Lab=Lab collect   04/05/21 1443   04/05/21 1443  Varicella-zoster by PCR (Blood or Swab)  Add-on,   AD        04/05/21 1443   04/05/21 1443  Hepatitis A antibody, IgM  Add-on,   AD  Question:  Specimen collection method  Answer:  Lab=Lab collect   04/05/21 1443   04/05/21 1443  Hepatitis B E antigen  Add-on,   AD       Question:  Specimen collection method  Answer:  Lab=Lab collect   04/05/21 1443   04/05/21 1443  Hepatitis B surface antigen  Add-on,   AD       Question:   Specimen collection method  Answer:  Lab=Lab collect   04/05/21 1443   04/05/21 1443  Hepatitis B DNA, ultraquantitative, PCR  Add-on,   AD       Question:  Specimen collection method  Answer:  Lab=Lab collect   04/05/21 1443   04/05/21 0500  Lipase, blood  Daily,   R      04/04/21 1345   04/05/21 0500  CBC with Differential/Platelet  Daily,   R      04/04/21 1345   04/05/21 0500  Comprehensive metabolic panel  Daily,   R      04/04/21 1345            Signed, Terrilee Croak, MD [facility] 04/05/2021

## 2021-04-05 NOTE — Progress Notes (Signed)
IR received request for Paracentesis, ultrasound imaging performed today and images reviewed with attending, Dr. Bryn Gulling. Trace amount of fluid seen in LLQ with overlying bowel, no safe percutaneous access seen, therefore the paracentesis was not performed.  Pattricia Boss PA-C Interventional Radiology  04/05/2021 9:30 AM

## 2021-04-05 NOTE — Progress Notes (Signed)
   04/05/21 1317  Clinical Encounter Type  Visited With Patient and family together  Visit Type Initial  Referral From Nurse  Consult/Referral To Chaplain   Chaplain Cashius Grandstaff completed an AD with PT. Chaplain proved emotional and spiritual support. Chaplain ministered with compassionate presence, reflective listening, and prayer.

## 2021-04-05 NOTE — Progress Notes (Signed)
[name] , MD [address], [place], [place], [address] [address], [place], [place], [address] Phone: [phone]  Fax: [phone]   Oscar Poole is being followed for acute liver failure, alcoholic hepatitis  Subjective: Alert and oriented.  Not in any pain or distress.  Son by his side.  States has not had a bowel movement in 3 days.   Objective: Vital signs in last 24 hours: Vitals:   04/05/21 0330 04/05/21 0400 04/05/21 0430 04/05/21 0502  BP:  (!) 99/57 106/69 112/70  Pulse: 75 76 75 76  Resp: 13 11 20    Temp:      TempSrc:      SpO2: 98% 95% 95%   Weight:      Height:       Weight change:   Intake/Output Summary (Last 24 hours) at 04/05/2021 1206 Last data filed at 04/05/2021 0630 Gross per 24 hour  Intake 1992.73 ml  Output 595 ml  Net 1397.73 ml     Exam: Heart:: Regular rate and rhythm, S1S2 present, or without murmur or extra heart sounds Lungs: normal Abdomen: Distended.  No guarding or rigidity, tenderness.  Bowel sounds present.   Lab Results: @LABTEST2 @ Micro Results: Recent Results (from the past 240 hour(s))  Urine Culture     Status: Abnormal   Collection Time: 04/02/21  9:07 PM   Specimen: Urine, Random  Result Value Ref Range Status   Specimen Description   Final    URINE, RANDOM Performed at [facility], [address] [place], [address] [place]    Special Requests   Final    NONE Performed at [facility], [address] [place], [address] [place]    Culture 80,000 COLONIES/mL STAPHYLOCOCCUS EPIDERMIDIS (A)  Final   Report Status 04/05/2021 FINAL  Final   Organism ID, Bacteria STAPHYLOCOCCUS EPIDERMIDIS (A)  Final      Susceptibility   Staphylococcus epidermidis - MIC*    CIPROFLOXACIN <=0.5 SENSITIVE Sensitive     GENTAMICIN <=0.5 SENSITIVE Sensitive     NITROFURANTOIN <=16 SENSITIVE Sensitive     OXACILLIN >=4 RESISTANT Resistant     TETRACYCLINE 2 SENSITIVE Sensitive      VANCOMYCIN <=0.5 SENSITIVE Sensitive     TRIMETH/SULFA 20 SENSITIVE Sensitive     CLINDAMYCIN <=0.25 SENSITIVE Sensitive     RIFAMPIN <=0.5 SENSITIVE Sensitive     Inducible Clindamycin NEGATIVE Sensitive     * 80,000 COLONIES/mL STAPHYLOCOCCUS EPIDERMIDIS  SARS CORONAVIRUS 2 (TAT 6-24 HRS) Nasopharyngeal Nasopharyngeal Swab     Status: None   Collection Time: 04/02/21  9:51 PM   Specimen: Nasopharyngeal Swab  Result Value Ref Range Status   SARS Coronavirus 2 NEGATIVE NEGATIVE Final    Comment: (NOTE) SARS-CoV-2 target nucleic acids are NOT DETECTED.  The SARS-CoV-2 RNA is generally detectable in upper and lower respiratory specimens during the acute phase of infection. Negative results do not preclude SARS-CoV-2 infection, do not rule out co-infections with other pathogens, and should not be used as the sole basis for treatment or other patient management decisions. Negative results must be combined with clinical observations, patient history, and epidemiological information. The expected result is Negative.  Fact Sheet for Patients: 04/07/2021  Fact Sheet for Healthcare Providers: 06/02/21  This test is not yet approved or cleared by the [employer] FDA and  has been authorized for detection and/or diagnosis of SARS-CoV-2 by FDA under an Emergency Use Authorization (EUA). This EUA will remain  in effect (meaning this test can be used) for the duration of the COVID-19 declaration under Se ction 564(b)(1) of the Act, 21 U.S.C. section 360bbb-3(b)(1), unless the authorization is terminated or revoked sooner.  Performed at [facility], [address], [place], [place] [address]   MRSA Next Gen by PCR, Nasal     Status: Abnormal   Collection Time: 04/03/21  6:37 AM   Specimen: Nasal Mucosa; Nasal Swab  Result Value Ref Range Status   MRSA by PCR Next Gen DETECTED (A) NOT DETECTED Final    Comment:  RESULT CALLED TO, READ BACK BY AND VERIFIED WITH: Fabian Sharp AT 1308 04/03/21.PMF (NOTE) The GeneXpert MRSA Assay (FDA approved for NASAL specimens only), is one component of a comprehensive MRSA colonization surveillance program. It is not intended to diagnose MRSA infection nor to guide or monitor treatment for MRSA infections. Test performance is not FDA approved in patients less than 10 years old. Performed at [facility], [address] [place], [place] [address]    Studies/Results: US Paracentesis  Result Date: 04/05/2021 CLINICAL DATA:  Ascites EXAM: LIMITED ABDOMEN ULTRASOUND FOR ASCITES TECHNIQUE: Limited ultrasound survey for ascites was performed in all four abdominal quadrants. COMPARISON:  CT abdomen pelvis 04/02/2021 FINDINGS: Minimal ascites noted in the left lower quadrant, insufficient for aspiration. IMPRESSION: Minimal left lower quadrant abdominal ascites, insufficient for paracentesis. Electronically Signed   By: Acquanetta Belling M.D.   On: 04/05/2021 09:57   Medications: I have reviewed the patient's current medications. Scheduled Meds:  Chlorhexidine Gluconate Cloth  6 each Topical Daily   folic acid  1 mg Oral Daily   lactulose  20 g Oral BID   multivitamin with minerals  1 tablet Oral Daily   mupirocin ointment   Nasal BID   pantoprazole  40 mg Oral BID AC   predniSONE  40 mg Oral Q breakfast   thiamine  100 mg Oral Daily   Or   thiamine  100 mg Intravenous Daily   Continuous Infusions:  cefTRIAXone (ROCEPHIN)  IV 1 g (04/04/21 2309)   lactated ringers 100 mL/hr at 04/05/21 0539   PRN Meds:.HYDROmorphone (DILAUDID) injection, LORazepam **OR** LORazepam   Assessment: Principal Problem:   Liver failure without hepatic coma (HCC) Active Problems:   Alcohol intoxication (HCC)   Hypokalemia   Acute renal failure (HCC)   Acute pancreatitis   Hypotension   Hyponatremia   Alcohol abuse   Ascites   Bilirubinemia  Oscar Poole 60 y.o.  male opioid abuse with a history of chronic alcohol and opioid use, hepatitis C presented to the emergency room with abdominal pain and decreased urine output on 04/02/2021.  Had elevated discomfort and function seen and assessed by Dr. Servando Snare on 04/03/2021, commenced on steroids for alcoholic hepatitis.  CT scan of the abdomen demonstrated fat stranding and inflammatory changes adjacent to distal duodenum favored to be duodenal in origin.  Possible duodenitis.  Features of hepatic cirrhosis and portal hypertension noted mild wall thickening of the ascending colon bilateral inguinal hernias and hiatal hernia noted.  Found to be in acute liver failure.  Recommended palliative care.  Urine in his bag appears very concentrated.  Long discussion with the son and patient that with possible liver and renal failure very high associated mortality and very important that goals of care be addressed.  Ascites was not sufficient to be tapped.    Plan: 1.  Continue supportive care the mainstay of therapy for alcoholic hepatitis  is to avoid alcohol intake and good nutrition.  Input from dietitian would help. 2.  Agree with palliative care to discuss goals of care. 3.  If the patient recovers from this episode will need an EGD as an outpatient to screen for esophageal varices 4.  I will check him for acute hepatitis a and B, , EBV, CMV, HSV, VZV 5.  Suggest to increase lactulose to 30 cc 4 times daily for constipation could also give an enema. 6.  Avoid opiates, benzodiazepines as it could precipitate hepatic encephalopathy 7.  Check INR tomorrow    LOS: 2 days   [name], MD 04/05/2021, 12:06 PM

## 2021-04-05 NOTE — TOC Initial Note (Signed)
Transition of Care [facility]) - Initial/Assessment Note    Patient Details  Name: Oscar Poole MRN: [id number] Date of Birth: Oct 22, 1960  Transition of [facility]) CM/SW Contact:    Marina Goodell Phone Number: [phone] 04/05/2021, 11:07 AM  Clinical Narrative:                  Patient presents to [facility] due to inability to urinate for 5 days along with pitting edema BLE, shob, general abd pain, vomiting, constipation 5 days, general jaundice.  Patient has hx of alcohol use and drinks on average 5-6 drinks per day, but has not drunk alcohol since onset of symptoms, about 5 days before ED visit. Patient is currently alert and oriented x1.  Patient is independent w/ ADLs at home.  Prognosis is poor and Palliative Care consult has been placed. Patient has supportive family and fiancee. CSW attempted main contacts for collateral information,  Sulaiman, Imbert (Brother) [phone], this number is not in service and Wilbern Pennypacker (son) [phone], mail box is full unable to leave voicemail.  TOC will continue to follow.   Expected Discharge Plan: Home w Home Health Services Barriers to Discharge: Continued Medical Work up   Patient Goals and CMS Choice        Expected Discharge Plan and Services Expected Discharge Plan: Home w Home Health Services In-house Referral: Clinical Social Work, Hospice / Palliative Care   Post Acute Care Choice: Home Health Living arrangements for the past 2 months: Mobile Home                                      Prior Living Arrangements/Services Living arrangements for the past 2 months: Mobile Home Lives with:: Domestic Partner Patient language and need for interpreter reviewed:: Yes Do you feel safe going back to the place where you live?: Yes      Need for Family Participation in Patient Care: Yes (Comment) Care giver support system in place?: No (comment)   Criminal Activity/Legal Involvement Pertinent to Current  Situation/Hospitalization: No - Comment as needed  Activities of Daily Living Home Assistive Devices/Equipment: None ADL Screening (condition at time of admission) Patient's cognitive ability adequate to safely complete daily activities?: Yes Is the patient deaf or have difficulty hearing?: No Does the patient have difficulty seeing, even when wearing glasses/contacts?: No Does the patient have difficulty concentrating, remembering, or making decisions?: No Patient able to express need for assistance with ADLs?: Yes Does the patient have difficulty dressing or bathing?: No Independently performs ADLs?: Yes (appropriate for developmental age) Does the patient have difficulty walking or climbing stairs?: No Weakness of Legs: Both Weakness of Arms/Hands: Both  Permission Sought/Granted Permission sought to share information with : Family Supports Permission granted to share information with : Yes, Verbal Permission Granted  Share Information with NAME: Redding, Cloe (Brother)   [phone] [facility] Phone), [place], [place]     [phone]           Emotional Assessment Appearance:: Appears older than stated age Attitude/Demeanor/Rapport: Unable to Assess Affect (typically observed): Unable to Assess Orientation: : Oriented to Self Alcohol / Substance Use: Alcohol Use, Other (comment) (ETOH and Suboxone, last drink 5 days ago, average 5-6 alcoholic drinks per day) Psych Involvement: No (comment)  Admission diagnosis:  Hypokalemia [E87.6] Bilirubinemia [E80.6] Alcohol abuse [F10.10] INR (international normal ratio) abnormal [R79.1] Acute cystitis without hematuria [N30.00] Ascites [R18.8] Liver  failure without hepatic coma (HCC) [K72.90] Alcohol-induced acute pancreatitis, unspecified complication status [K85.20] Patient Active Problem List   Diagnosis Date Noted   Ascites    Bilirubinemia    Liver failure without hepatic coma (HCC) 04/03/2021   Acute renal failure (HCC)  04/03/2021   Acute pancreatitis 04/03/2021   Hypotension 04/03/2021   Hyponatremia 04/03/2021   Alcohol abuse    Opiate overdose (HCC) 11/14/2015   Alcohol intoxication (HCC) 11/14/2015   Hypokalemia 11/14/2015   AKI (acute kidney injury) (HCC) 11/14/2015   Overdose 11/14/2015   PCP:  [name], MD Pharmacy:   [facility] [address], [place] - [address] [place] [address] Phone: [phone] Fax: [phone]     Social Determinants of Health (SDOH) Interventions    Readmission Risk Interventions No flowsheet data found.

## 2021-04-05 NOTE — Progress Notes (Signed)
Patient slept through the night, awakening minimally for care and medications. Patient noted to desat to the low 80s when he would get into a deep sleep. O2 was increased to 3L via . Sats are now 94%. Patient is confused to the date, time and situation. Foley cath care provided in accordance with the CAUTI protocol. Safety maintained. Fall precautions maintained. No complaints of pain voiced. Several family member's have called throughout the night.

## 2021-04-05 NOTE — ACP (Advance Care Planning) (Signed)
PT has AD as of 04/05/21  Point of Contact:  Kratos Ruscitti  [phone]  Burna Mortimer Page [phone]

## 2021-04-06 ENCOUNTER — Encounter: Payer: Self-pay | Admitting: Family Medicine

## 2021-04-06 DIAGNOSIS — Z515 Encounter for palliative care: Secondary | ICD-10-CM

## 2021-04-06 DIAGNOSIS — K7200 Acute and subacute hepatic failure without coma: Secondary | ICD-10-CM | POA: Diagnosis not present

## 2021-04-06 DIAGNOSIS — Z7189 Other specified counseling: Secondary | ICD-10-CM | POA: Diagnosis not present

## 2021-04-06 DIAGNOSIS — N179 Acute kidney failure, unspecified: Secondary | ICD-10-CM | POA: Diagnosis not present

## 2021-04-06 LAB — CBC WITH DIFFERENTIAL/PLATELET
Abs Immature Granulocytes: 0.16 10*3/uL — ABNORMAL HIGH (ref 0.00–0.07)
Basophils Absolute: 0.1 10*3/uL (ref 0.0–0.1)
Basophils Relative: 1 %
Eosinophils Absolute: 0.1 10*3/uL (ref 0.0–0.5)
Eosinophils Relative: 1 %
HCT: 39.1 % (ref 39.0–52.0)
Hemoglobin: 14.5 g/dL (ref 13.0–17.0)
Immature Granulocytes: 2 %
Lymphocytes Relative: 7 %
Lymphs Abs: 0.5 10*3/uL — ABNORMAL LOW (ref 0.7–4.0)
MCH: 36.3 pg — ABNORMAL HIGH (ref 26.0–34.0)
MCHC: 37.1 g/dL — ABNORMAL HIGH (ref 30.0–36.0)
MCV: 97.8 fL (ref 80.0–100.0)
Monocytes Absolute: 0.8 10*3/uL (ref 0.1–1.0)
Monocytes Relative: 9 %
Neutro Abs: 6.5 10*3/uL (ref 1.7–7.7)
Neutrophils Relative %: 80 %
Platelets: 59 10*3/uL — ABNORMAL LOW (ref 150–400)
RBC: 4 MIL/uL — ABNORMAL LOW (ref 4.22–5.81)
RDW: 17 % — ABNORMAL HIGH (ref 11.5–15.5)
Smear Review: NORMAL
WBC: 8.1 10*3/uL (ref 4.0–10.5)
nRBC: 0 % (ref 0.0–0.2)

## 2021-04-06 LAB — PROTIME-INR
INR: 2.5 — ABNORMAL HIGH (ref 0.8–1.2)
Prothrombin Time: 26.9 seconds — ABNORMAL HIGH (ref 11.4–15.2)

## 2021-04-06 LAB — CMV DNA, QUANTITATIVE, PCR
CMV DNA Quant: NEGATIVE IU/mL
Log10 CMV Qn DNA Pl: UNDETERMINED log10 IU/mL

## 2021-04-06 LAB — GLUCOSE, CAPILLARY: Glucose-Capillary: 142 mg/dL — ABNORMAL HIGH (ref 70–99)

## 2021-04-06 LAB — HSV(HERPES SIMPLEX VRS) I + II AB-IGM: HSVI/II Comb IgM: 1.7 Ratio — ABNORMAL HIGH (ref 0.00–0.90)

## 2021-04-06 LAB — HEPATITIS B E ANTIGEN: Hep B E Ag: NEGATIVE

## 2021-04-06 LAB — LIPASE, BLOOD: Lipase: 310 U/L — ABNORMAL HIGH (ref 11–51)

## 2021-04-06 MED ORDER — ONDANSETRON HCL 4 MG/2ML IJ SOLN
INTRAMUSCULAR | Status: AC
  Filled 2021-04-06: qty 2

## 2021-04-06 MED ORDER — ONDANSETRON HCL 4 MG/2ML IJ SOLN
4.0000 mg | Freq: Four times a day (QID) | INTRAMUSCULAR | Status: DC | PRN
  Administered 2021-04-06: 4 mg via INTRAVENOUS

## 2021-04-06 NOTE — Plan of Care (Signed)
Neuro: A&O x 2-3 intermittently, has periods where he can rouse and remain awake for short periods of time, rolling side to side in bed Resp: stable on 3L [place] CV: afebrile, vital signs fairly stable edema present throughout GIGU:foley in place, large type 6-7 BM x 1, appetite decreasing significantly-primarily taking fluids with medications, Zofran given x 1 for nausea Skin: profoundly jaundice, dry/flaky/cracking Social: Marchia Meiers and family friend visiting today, all questions and concerns addressed.  Events: No events or deviations.  Problem: Education: Goal: Knowledge of General Education information will improve Description: Including pain rating scale, medication(s)/side effects and non-pharmacologic comfort measures Outcome: Not Progressing   Problem: Health Behavior/Discharge Planning: Goal: Ability to manage health-related needs will improve Outcome: Not Progressing   Problem: Clinical Measurements: Goal: Ability to maintain clinical measurements within normal limits will improve Outcome: Not Progressing Goal: Will remain free from infection Outcome: Not Progressing Goal: Diagnostic test results will improve Outcome: Not Progressing Goal: Respiratory complications will improve Outcome: Not Progressing Goal: Cardiovascular complication will be avoided Outcome: Not Progressing   Problem: Activity: Goal: Risk for activity intolerance will decrease Outcome: Not Progressing   Problem: Nutrition: Goal: Adequate nutrition will be maintained Outcome: Not Progressing   Problem: Coping: Goal: Level of anxiety will decrease Outcome: Not Progressing   Problem: Elimination: Goal: Will not experience complications related to bowel motility Outcome: Not Progressing Goal: Will not experience complications related to urinary retention Outcome: Not Progressing   Problem: Pain Managment: Goal: General experience of comfort will improve Outcome: Not Progressing   Problem:  Safety: Goal: Ability to remain free from injury will improve Outcome: Not Progressing   Problem: Skin Integrity: Goal: Risk for impaired skin integrity will decrease Outcome: Not Progressing

## 2021-04-06 NOTE — Consult Note (Signed)
Consultation Note Date: 04/06/2021   Patient Name: Oscar Poole  DOB: 1961-04-18  MRN: [id number]  Age / Sex: 60 y.o., male  PCP: [name], MD Referring Physician: Lorin Glass, MD  Reason for Consultation: Establishing goals of care and Psychosocial/spiritual support  HPI/Patient Profile: 60 y.o. 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   Clinical Assessment and Goals of Care: I have reviewed medical records including EPIC notes, labs and imaging, received report from RN, assessed the patient.  Oscar Poole is lying quietly in bed.  He appears acutely/chronically ill, morbidly obese.  He is oriented to self only, I am not sure that he is able to make his basic needs known.  There is no family at bedside at this time.    Call to healthcare surrogate, son, Oscar Poole to discuss diagnosis prognosis, GOC, EOL wishes, disposition and options.  I introduced Palliative Medicine as specialized medical care for people living with serious illness. It focuses on providing relief from the symptoms and stress of a serious illness. The goal is to improve quality of life for both the patient and the family.  We talked about Oscar Poole acute health concerns including but not limited to liver failure, kidney failure, leaving toward multisystem organ failure.  Oscar Poole states that he has been expecting continued declines.  We focused on their current illness. The natural disease trajectory and expectations at EOL were discussed.  Advanced directives, concepts specific to code status, were considered and discussed.  Patient and significant other, Oscar Poole, endorsed DNR at bedside.  Call to son, Oscar Poole who also endorses DNR.   Orders adjusted.     We talk about liver failure, kidney failure, worsening to multisystem organ failure.  We talked about comfort care in the next few days.  Oscar Poole states that he has been preparing himself for the eventuality that his father will not survive.  Discussed the importance of continued conversation with family and the medical providers regarding overall plan of care and treatment options, ensuring decisions are within the context of the patient's values and GOCs. Questions and concerns were addressed. The family was encouraged to call with questions or concerns.  PMT will continue to support holistically.  Conference with attending, bedside nursing staff, transition of care team related to patient condition, needs, goals of care. PMT to continue to follow.  HCPOA   NEXT OF KIN -Oscar Poole names his son, Oscar Poole. Manson Passey and his fiance, Oscar Poole page as his healthcare surrogates.  He is legally separated from [facility], and does not want her as his primary decision maker contact.    SUMMARY OF RECOMMENDATIONS   At this point continue to treat the treatable No CPR or intubation  Time for outcomes   Code Status/Advance Care Planning: DNR - verified with patient, Oscar Poole, and son Oscar Poole.   Symptom Management:  Per  hospitalist/CCM, no additional needs at this time.  Palliative Prophylaxis:  Frequent Pain Assessment and Oral Care  Additional Recommendations (Limitations, Scope, Preferences): Full Scope Treatment  Psycho-social/Spiritual:  Desire for further Chaplaincy support:no Additional Recommendations: Caregiving  Support/Resources and ICU Family Guide  Prognosis:  Unable to determine, based on outcomes. Guarded at this point.   Discharge Planning:  to be determined, based on outcomes.        Primary Diagnoses: Present on Admission:  Liver failure without hepatic coma (HCC)  Hypokalemia  Alcohol intoxication (HCC)   I have reviewed the medical record,  interviewed the patient and family, and examined the patient. The following aspects are pertinent.  Past Medical History:  Diagnosis Date   Alcoholic (HCC)    Back pain    Hepatitis B    Hepatitis C    Social History   Socioeconomic History   Marital status: Legally Separated    Spouse name: Not on file   Number of children: Not on file   Years of education: Not on file   Highest education level: Not on file  Occupational History   Not on file  Tobacco Use   Smoking status: Never   Smokeless tobacco: Never  Substance and Sexual Activity   Alcohol use: Yes    Alcohol/week: 0.0 standard drinks    Comment: 6 10% alcohol beers per day   Drug use: Not Currently   Sexual activity: Never  Other Topics Concern   Not on file  Social History Narrative   Not on file   Social Determinants of Health   Financial Resource Strain: Not on file  Food Insecurity: Not on file  Transportation Needs: Not on file  Physical Activity: Not on file  Stress: Not on file  Social Connections: Not on file   Family History  Problem Relation Age of Onset   Emphysema Mother 27   Heart attack Father 55   Heart attack Brother 73   Prostate cancer Neg Hx    Kidney cancer Neg Hx    Bladder Cancer Neg Hx    Scheduled Meds:  Chlorhexidine Gluconate Cloth  6 each Topical Daily   folic acid  1 mg Oral Daily   lactulose  30 g Oral QID   multivitamin with minerals  1 tablet Oral Daily   mupirocin ointment   Nasal BID   pantoprazole  40 mg Oral BID AC   predniSONE  40 mg Oral Q breakfast   thiamine  100 mg Oral Daily   Or   thiamine  100 mg Intravenous Daily   Continuous Infusions:  cefTRIAXone (ROCEPHIN)  IV Stopped (04/05/21 2259)   lactated ringers 100 mL/hr at 04/06/21 1200   PRN Meds:.HYDROmorphone (DILAUDID) injection Medications Prior to Admission:  Prior to Admission medications   Medication Sig Start Date End Date Taking? Authorizing Provider  amLODipine (NORVASC) 5 MG tablet Take  5 mg by mouth every morning. 03/10/21  Yes [provider]  famotidine (PEPCID) 20 MG tablet Take 20 mg by mouth 2 (two) times daily. 12/25/20  Yes [provider]  gabapentin (NEURONTIN) 300 MG capsule Take 900 mg by mouth 2 (two) times daily. 03/20/21  Yes [provider]  hydrochlorothiazide (HYDRODIURIL) 25 MG tablet Take 25 mg by mouth every morning. 03/10/21  Yes [provider]  lisinopril (ZESTRIL) 40 MG tablet Take 40 mg by mouth daily. 03/15/21  Yes [provider]  PROAIR HFA 108 (90 Base) MCG/ACT inhaler Inhale 1-2 puffs into the  lungs every 4 (four) hours as needed. 03/10/21  Yes [provider]  SUBOXONE 8-2 MG FILM Place 1 Film under the tongue 3 (three) times daily. 03/19/21  Yes [provider]   No Known Allergies Review of Systems  Unable to perform ROS: Acuity of condition   Physical Exam Vitals and nursing note reviewed.  Constitutional:      General: He is not in acute distress.    Appearance: He is obese. He is ill-appearing.  Cardiovascular:     Rate and Rhythm: Normal rate.  Pulmonary:     Effort: Pulmonary effort is normal. No respiratory distress.  Abdominal:     General: There is distension.     Palpations: Abdomen is rigid.  Skin:    General: Skin is warm and dry.  Neurological:     Comments: Oriented to self only at this time    Vital Signs: BP (!) 106/58 (BP Location: Left Wrist)   Pulse 75   Temp 97.8 F (36.6 C) (Oral)   Resp 11   Ht 6' (1.829 m)   Wt (!) 148 kg   SpO2 92%   BMI 44.25 kg/m  Pain Scale: 0-10   Pain Score: Asleep   SpO2: SpO2: 92 % O2 Device:SpO2: 92 % O2 Flow Rate: .O2 Flow Rate (L/min): 3 L/min  IO: Intake/output summary:  Intake/Output Summary (Last 24 hours) at 04/06/2021 1353 Last data filed at 04/06/2021 1200 Gross per 24 hour  Intake 1868.97 ml  Output 1160 ml  Net 708.97 ml    LBM: Last BM Date: 04/06/21 Baseline Weight: Weight: (!) 148 kg Most  recent weight: Weight: (!) 148 kg     Palliative Assessment/Data:   Flowsheet Rows    Flowsheet Row Most Recent Value  Intake Tab   Referral Department Hospitalist  Unit at Time of Referral Intermediate Care Unit  Palliative Care Primary Diagnosis Other (Comment)  Date Notified 04/03/21  Palliative Care Type New Palliative care  Reason for referral Clarify Goals of Care  Date of Admission 04/02/21  Date first seen by Palliative Care 04/06/21  # of days Palliative referral response time 3 Day(s)  # of days IP prior to Palliative referral 1  Clinical Assessment   Palliative Performance Scale Score 20%  Pain Max last 24 hours Not able to report  Pain Min Last 24 hours Not able to report  Dyspnea Max Last 24 Hours Not able to report  Dyspnea Min Last 24 hours Not able to report  Psychosocial & Spiritual Assessment   Palliative Care Outcomes        Time In: 1030 Time Out: 1140 Time Total: 70 minutes  Greater than 50%  of this time was spent counseling and coordinating care related to the above assessment and plan.  Signed by: Katheran Awe, NP   Please contact Palliative Medicine Team phone at [phone] for questions and concerns.  For individual provider: See Loretha Stapler

## 2021-04-06 NOTE — Progress Notes (Signed)
PROGRESS NOTE  Oscar Poole  DOB: [birth year]  PCP: Mendel Ryder, MD NLZ:[id number]  DOA: 04/02/2021  LOS: 3 days  Hospital Day: 5   Chief Complaint  Patient presents with   Abdominal Pain    Brief narrative: Oscar Poole is a 60 y.o. male with PMH significant for chronic hepatitis C, history of alcohol and opiate use on Suboxone, hypertension. Patient presented to the ED on 8/12 with complaint of worsening abdominal pain, constipation and decreased urine output. Abdominal pain diffuse for 4 to 5 days associated with nausea vomiting, no bowel movement in 4 to 5 days.  Because of constipation for the duration, he stopped Suboxone.  No urine output in 4 to 5 days. Reports drinking about 5-6 cocktails daily but is unable to do so for the last 5 days because of which he started having tremors.  In the ED, patient was afebrile, blood pressure was initially elevated 278/133 but later on dipped down to 89/62 in few hours Labs with significant abnormality including sodium low at 126, potassium low at 2.8, lipase elevated to 342, AST/ALT/alk phos at 147/64/108, total bilirubin elevated to 22 with direct component significantly elevated to 18, creatinine was unable to calculate because of high level of bilirubin CBC with hemoglobin 15, MCV 102, platelet 97, INR 2.6 Tylenol level less than 10.1.  CT abdomen pelvis showed concern of duodenitis, evidence of hepatic cirrhosis with portal hypertension, small volume ascites.  CBD not well defined but no evidence of biliary dilatation. Admitted to hospitalist service GI was consulted. See below for details  Subjective: Patient was seen and examined this morning.  Remains drowsy, barely able to open eyes on verbal command.  Unable to have a meaningful conversation today.  Pending palliative care consultation.  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  Patient's clinical and laboratory status worsening. -His prognosis is very poor.  This has been shared with the family multiple times. -GI consult appreciated.  Currently getting supportive care with IV Rocephin for SBP prophylaxis as well as prednisone for alcoholic hepatitis.   -Patient does not have enough ascites to tap.   -On lactulose as needed. Recent Labs  Lab 04/02/21 2108 04/03/21 1052 04/04/21 0749 04/05/21 0544 04/06/21 0521  AST 147* 133* 112* 115* 167*  ALT 64* 63* 54* 54* 83*  ALKPHOS 108 77 64 71 81  BILITOT 22.2* 35.1* 29.5* 29.4* NOT VALID  PROT 7.9 7.4 5.8* 6.5 6.9  ALBUMIN 2.1* 2.4* 1.8* 1.8* 1.8*    Recent Labs  Lab 04/02/21 2151 04/03/21 1052 04/04/21 0749  INR 2.6* 2.7* 2.6*    Recent Labs  Lab 04/02/21 2108 04/05/21 0544  AMMONIA 50* 105*    Acute pancreatitis/duodenitis -Presented with progressively worsening abdominal pain in the setting of heavy alcohol use -Lipase level elevated, liver enzymes elevated -CT abdomen findings are more suggestive of duodenitis and pancreatitis. -Patient was started on conservative management with IV fluid, IV albumin, pain medicines, antiemetics and PPI -Repeat lipase is worse today. Recent Labs  Lab 04/02/21 2108 04/05/21 0544 04/06/21 0521  LIPASE 342* 150* 310*     Acute kidney injury -Creatinine is unable to assess because of high bilirubin level.  But clinically patient is making minimal urine output.  High risk of hepatorenal syndrome.  Currently on LR at 100 mill per hour which I will continue. Recent Labs    04/02/21 2108 04/03/21 1052  04/04/21 0411 04/05/21 0544 04/06/21 0521  BUN 42* 46* 55* 75* 91*  CREATININE UNABLE TO REPORT DUE TO ICTERUS DLB UNABLE TO REPORT DUE TO ICTERUS UNABLE TO REPORT DUE TO ICTERUS UNABLE TO REPORT DUE TO ICTERUS UNABLE TO REPORT DUE TO ICTERUS    Intake/Output Summary (Last 24 hours) at 04/06/2021 1433 Last data  filed at 04/06/2021 1400 Gross per 24 hour  Intake 2068.9 ml  Output 1160 ml  Net 908.9 ml    Essential hypertension -Blood pressure in low normal range.  Currently Home meds on hold including amlodipine, HCTZ and lisinopril.  Continue IV fluid. -Because of coexisting portal hypertension, patient may benefit from initiation of beta-blocker and diuretics prior to discharge if clinically improves    Hyponatremia -Likely hypervolemic hyponatremia related to portal hypertension, probably also contributed by low solute intake in last 4 to 5 days. -Continues to have worsening hyponatremia. Recent Labs  Lab 04/02/21 2108 04/03/21 1052 04/04/21 0411 04/05/21 0544 04/06/21 0521  NA 126* 127* 128* 125* 126*    Chronic alcoholism -Minimize use of IV Ativan to avoid further encephalopathy  Mobility: PT eval if patient clinically improved Code Status:   Code Status: Full Code  Nutritional status: Body mass index is 44.25 kg/m.     Diet:  Diet Order             Diet Heart Room service appropriate? Yes; Fluid consistency: Thin  Diet effective now                  DVT prophylaxis:  SCDs Start: 04/03/21 0032   Antimicrobials: IV Rocephin Fluid: LR_0  mill per hour Consultants: GI, palliative care consulted Family Communication: Discussed with palliative care team.  Discussed with family yesterday.  Status is: Inpatient  Remains inpatient appropriate because: Critically ill  Dispo: The patient is from: Home              Anticipated d/c is to: Poor prognosis.  High likelihood of in-hospital mortality.              Patient currently is not medically stable to d/c.   Difficult to place patient No     Infusions:   cefTRIAXone (ROCEPHIN)  IV Stopped (04/05/21 2259)   lactated ringers 100 mL/hr at 04/06/21 1400    Scheduled Meds:  Chlorhexidine Gluconate Cloth  6 each Topical Daily   folic acid  1 mg Oral Daily   lactulose  30 g Oral QID   multivitamin with minerals   1 tablet Oral Daily   mupirocin ointment   Nasal BID   pantoprazole  40 mg Oral BID AC   predniSONE  40 mg Oral Q breakfast   thiamine  100 mg Oral Daily   Or   thiamine  100 mg Intravenous Daily    Antimicrobials: Anti-infectives (From admission, onward)    Start     Dose/Rate Route Frequency Ordered Stop   04/04/21 2300  cefTRIAXone (ROCEPHIN) 1 g in sodium chloride 0.9 % 100 mL IVPB        1 g 200 mL/hr over 30 Minutes Intravenous Every 24 hours 04/03/21 0035     04/02/21 2315  cefTRIAXone (ROCEPHIN) 1 g in sodium chloride 0.9 % 100 mL IVPB        1 g 200 mL/hr over 30 Minutes Intravenous  Once 04/02/21 2313 04/02/21 2348       PRN meds: HYDROmorphone (DILAUDID) injection   Objective: Vitals:   04/06/21 1000 04/06/21 1200  BP: (!) 101/49 (!) 106/58  Pulse: 74 75  Resp: 13 11  Temp:  97.8 F (36.6 C)  SpO2: 92% 92%    Intake/Output Summary (Last 24 hours) at 04/06/2021 1433 Last data filed at 04/06/2021 1400 Gross per 24 hour  Intake 2068.9 ml  Output 1160 ml  Net 908.9 ml    Filed Weights   04/02/21 2102  Weight: (!) 148 kg   Weight change:  Body mass index is 44.25 kg/m.   Physical Exam: General exam: Morbidly obese middle-aged male.  Semicomatose.  Not in distress HEENT: Atraumatic, normocephalic, no obvious bleeding Lungs: Diminished air entry at both bases CVS: Regular rate and rhythm, no murmur GI/Abd soft, mild diffuse tenderness present, distended from obesity and ascites  CNS: Semicomatose.  Tries to open eyes on verbal command Psychiatry: Depressed look Extremities: No pedal edema, no calf tenderness  Data Review: I have personally reviewed the laboratory data and studies available.  Recent Labs  Lab 04/02/21 2108 04/03/21 1052 04/04/21 0411 04/05/21 0544 04/06/21 0521  WBC 5.2 5.5 4.9 6.8 8.1  NEUTROABS  --   --  3.7 5.6 6.5  HGB 15.1 14.3 13.7 13.1 14.5  HCT 41.4 39.3 36.6* 35.6* 39.1  MCV 101.7* 101.8* 101.9* 100.8* 97.8  PLT  97* 54* 55* 64* 59*    Recent Labs  Lab 04/02/21 2108 04/03/21 1052 04/04/21 0411 04/05/21 0544 04/06/21 0521  NA 126* 127* 128* 125* 126*  K 2.8* 4.0 3.9 3.9 3.6  CL 88* 90* 92* 92* 93*  CO2 21* _0 GLUCOSE 132* 121* 107* 121* 129*  BUN 42* 46* 55* 75* 91*  CREATININE UNABLE TO REPORT DUE TO ICTERUS DLB UNABLE TO REPORT DUE TO ICTERUS UNABLE TO REPORT DUE TO ICTERUS UNABLE TO REPORT DUE TO ICTERUS UNABLE TO REPORT DUE TO ICTERUS  CALCIUM 8.4* 8.2* 8.0* 7.6* 7.8*  MG 2.2  --  2.2  --   --   PHOS  --   --  UNABLE TO REPORT DUE TO ICTERUS  --   --      F/u labs ordered Unresulted Labs (From admission, onward)     Start     Ordered   04/06/21 1443  Protime-INR  Once,   R       Question:  Specimen collection method  Answer:  Lab=Lab collect   04/05/21 1443   04/05/21 1444  EBV ab to viral capsid ag pnl, IgG+IgM  Add-on,   AD       Question:  Specimen collection method  Answer:  Lab=Lab collect   04/05/21 1443   04/05/21 1443  HSV(herpes simplex vrs) 1+2 ab-IgM  Add-on,   AD       Question:  Specimen collection method  Answer:  Lab=Lab collect   04/05/21 1443   04/05/21 1443  CMV DNA, quantitative, PCR  Add-on,   AD       Question:  Specimen collection method  Answer:  Lab=Lab collect   04/05/21 1443   04/05/21 1443  Varicella-zoster by PCR (Blood or Swab)  Add-on,   AD        04/05/21 1443   04/05/21 1443  Hepatitis B DNA, ultraquantitative, PCR  Add-on,   AD       Question:  Specimen collection method  Answer:  Lab=Lab collect   04/05/21 1443   04/05/21 0500  Lipase, blood  Daily,   R      04/04/21 1345   04/05/21 0500  CBC with Differential/Platelet  Daily,   R      04/04/21 1345   04/05/21 0500  Comprehensive metabolic panel  Daily,   R      04/04/21 1345            Signed, Terrilee Croak, MD [facility] 04/06/2021

## 2021-04-07 DIAGNOSIS — Z7189 Other specified counseling: Secondary | ICD-10-CM | POA: Diagnosis not present

## 2021-04-07 DIAGNOSIS — K7200 Acute and subacute hepatic failure without coma: Secondary | ICD-10-CM | POA: Diagnosis not present

## 2021-04-07 DIAGNOSIS — N179 Acute kidney failure, unspecified: Secondary | ICD-10-CM | POA: Diagnosis not present

## 2021-04-07 DIAGNOSIS — Z515 Encounter for palliative care: Secondary | ICD-10-CM | POA: Diagnosis not present

## 2021-04-07 LAB — HEPATITIS B DNA, ULTRAQUANTITATIVE, PCR
HBV DNA SERPL PCR-ACNC: NOT DETECTED IU/mL
HBV DNA SERPL PCR-LOG IU: UNDETERMINED log10 IU/mL

## 2021-04-07 LAB — CBC WITH DIFFERENTIAL/PLATELET
Abs Immature Granulocytes: 0.2 10*3/uL — ABNORMAL HIGH (ref 0.00–0.07)
Basophils Absolute: 0 10*3/uL (ref 0.0–0.1)
Basophils Relative: 0 %
Eosinophils Absolute: 0.1 10*3/uL (ref 0.0–0.5)
Eosinophils Relative: 1 %
HCT: 36.3 % — ABNORMAL LOW (ref 39.0–52.0)
Hemoglobin: 13.3 g/dL (ref 13.0–17.0)
Immature Granulocytes: 2 %
Lymphocytes Relative: 6 %
Lymphs Abs: 0.5 10*3/uL — ABNORMAL LOW (ref 0.7–4.0)
MCH: 36.1 pg — ABNORMAL HIGH (ref 26.0–34.0)
MCHC: 36.6 g/dL — ABNORMAL HIGH (ref 30.0–36.0)
MCV: 98.6 fL (ref 80.0–100.0)
Monocytes Absolute: 0.8 10*3/uL (ref 0.1–1.0)
Monocytes Relative: 9 %
Neutro Abs: 7.4 10*3/uL (ref 1.7–7.7)
Neutrophils Relative %: 82 %
Platelets: 65 10*3/uL — ABNORMAL LOW (ref 150–400)
RBC: 3.68 MIL/uL — ABNORMAL LOW (ref 4.22–5.81)
RDW: 17.3 % — ABNORMAL HIGH (ref 11.5–15.5)
WBC: 9.1 10*3/uL (ref 4.0–10.5)
nRBC: 0 % (ref 0.0–0.2)

## 2021-04-07 LAB — COMPREHENSIVE METABOLIC PANEL
ALT: 89 U/L — ABNORMAL HIGH (ref 0–44)
AST: 172 U/L — ABNORMAL HIGH (ref 15–41)
Albumin: 1.8 g/dL — ABNORMAL LOW (ref 3.5–5.0)
Alkaline Phosphatase: 88 U/L (ref 38–126)
Anion gap: 11 (ref 5–15)
BUN: 95 mg/dL — ABNORMAL HIGH (ref 6–20)
CO2: 21 mmol/L — ABNORMAL LOW (ref 22–32)
Calcium: 8 mg/dL — ABNORMAL LOW (ref 8.9–10.3)
Chloride: 93 mmol/L — ABNORMAL LOW (ref 98–111)
Creatinine, Ser: UNDETERMINED mg/dL (ref 0.61–1.24)
Glucose, Bld: 131 mg/dL — ABNORMAL HIGH (ref 70–99)
Potassium: 3.6 mmol/L (ref 3.5–5.1)
Sodium: 125 mmol/L — ABNORMAL LOW (ref 135–145)
Total Bilirubin: 30.3 mg/dL (ref 0.3–1.2)
Total Protein: 6.7 g/dL (ref 6.5–8.1)

## 2021-04-07 LAB — EBV AB TO VIRAL CAPSID AG PNL, IGG+IGM
EBV VCA IgG: >600 U/mL — ABNORMAL HIGH (ref 0.0–17.9)
EBV VCA IgM: <36 U/mL (ref 0.0–35.9)

## 2021-04-07 LAB — LIPASE, BLOOD: Lipase: 284 U/L — ABNORMAL HIGH (ref 11–51)

## 2021-04-07 MED ORDER — LACTULOSE 10 GM/15ML PO SOLN
20.0000 g | Freq: Two times a day (BID) | ORAL | Status: DC
  Administered 2021-04-07 – 2021-04-12 (×10): 20 g via ORAL
  Filled 2021-04-07 (×10): qty 30

## 2021-04-07 MED ORDER — HYDROMORPHONE HCL 2 MG PO TABS
2.0000 mg | ORAL_TABLET | Freq: Four times a day (QID) | ORAL | Status: DC | PRN
Start: 2021-04-07 — End: 2021-04-12
  Administered 2021-04-07 – 2021-04-12 (×15): 2 mg via ORAL
  Filled 2021-04-07 (×17): qty 1

## 2021-04-07 NOTE — Plan of Care (Signed)
Neuro: alert and oriented x 2-3 when able to rouse, he is very lethargic and becoming more difficult to rouse and stay awake, baseline RASS is consistenly -2 to -3, he is no longer able to roll side to side by his own power Resp: stable on room air, no desaturations CV: afebrile, vital signs fairly stable, edema is increasing GIGU: foley in place, large type 6-7 BM x 1 (lactulose decreased per hosp MD), only waking long enough to take sips with medications and doing so safely, no appetite per patient  Skin: profoundly jaundice, dry/flaky/cracking Social: Daughter in law called for update this AM, all questions and concerns addressed, no visitors  Events: No events or deviations.  Problem: Education: Goal: Knowledge of General Education information will improve Description: Including pain rating scale, medication(s)/side effects and non-pharmacologic comfort measures Outcome: Not Progressing   Problem: Health Behavior/Discharge Planning: Goal: Ability to manage health-related needs will improve Outcome: Not Progressing   Problem: Clinical Measurements: Goal: Ability to maintain clinical measurements within normal limits will improve Outcome: Not Progressing Goal: Will remain free from infection Outcome: Not Progressing Goal: Diagnostic test results will improve Outcome: Not Progressing Goal: Respiratory complications will improve Outcome: Not Progressing Goal: Cardiovascular complication will be avoided Outcome: Not Progressing   Problem: Activity: Goal: Risk for activity intolerance will decrease Outcome: Not Progressing   Problem: Nutrition: Goal: Adequate nutrition will be maintained Outcome: Not Progressing   Problem: Coping: Goal: Level of anxiety will decrease Outcome: Not Progressing   Problem: Elimination: Goal: Will not experience complications related to bowel motility Outcome: Not Progressing Goal: Will not experience complications related to urinary  retention Outcome: Not Progressing   Problem: Pain Managment: Goal: General experience of comfort will improve Outcome: Not Progressing   Problem: Safety: Goal: Ability to remain free from injury will improve Outcome: Not Progressing   Problem: Skin Integrity: Goal: Risk for impaired skin integrity will decrease Outcome: Not Progressing

## 2021-04-07 NOTE — Progress Notes (Signed)
Order Requisition for Advanced Directive. Spoke to nurse who shared the patient's mental capacity has declined. I never attempted to share the education for the document because of this information. If capacity is established please contact the on call chaplain to complete the document.

## 2021-04-07 NOTE — Progress Notes (Signed)
[name]  DOB: [birth year]  PCP: Mendel Ryder, MD UTM:[id number]  DOA: 04/02/2021  LOS: 4 days  Hospital Day: 6   Chief Complaint  Patient presents with   Abdominal Pain    Brief narrative: Oscar Poole is a 60 y.o. male with PMH significant for chronic hepatitis C, history of alcohol and opiate use on Suboxone, hypertension. Patient presented to the ED on 8/12 with complaint of worsening abdominal pain, constipation and decreased urine output. Abdominal pain diffuse for 4 to 5 days associated with nausea vomiting, no bowel movement in 4 to 5 days.  Because of constipation for the duration, he stopped Suboxone.  No urine output in 4 to 5 days. Reports drinking about 5-6 cocktails daily but is unable to do so for the last 5 days because of which he started having tremors.  In the ED, patient was afebrile, blood pressure was initially elevated 278/133 but later on dipped down to 89/62 in few hours Labs with significant abnormality including sodium low at 126, potassium low at 2.8, lipase elevated to 342, AST/ALT/alk phos at 147/64/108, total bilirubin elevated to 22 with direct component significantly elevated to 18, creatinine was unable to calculate because of high level of bilirubin CBC with hemoglobin 15, MCV 102, platelet 97, INR 2.6 Tylenol level less than 10.1.  CT abdomen pelvis showed concern of duodenitis, evidence of hepatic cirrhosis with portal hypertension, small volume ascites.  CBD not well defined but no evidence of biliary dilatation. Admitted to hospitalist service GI was consulted. See below for details  Subjective: Pt was somnolent, but intermittently following commands.  During his more lucid moments, pt expressed his desires to continue treatment.   RN reported excessive diarrhea, lactulose tapered down.  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  Patient's clinical and laboratory status worsening. -His prognosis is very poor.  This has been shared with the family multiple times.   -GI consult appreciated.   -Patient does not have enough ascites to tap.   Plan: --cont ceftriaxone for SBP ppx --cont prednisone for alcoholic hepatitis  Hepatic encephalopathy --ammonia 105.  Started on aggressive lactulose.  Having excessive diarrhea today --taper lactulose down to 20g BID  Acute pancreatitis/duodenitis -Presented with progressively worsening abdominal pain in the setting of heavy alcohol use -Lipase level elevated, liver enzymes elevated -CT abdomen findings are more suggestive of duodenitis and pancreatitis. -Patient was started on conservative management with IV fluid, IV albumin, pain medicines, antiemetics and PPI Plan: --switch from IV dilaudid to oral dilaudid --reduce LR to 50 ml/hr  Acute kidney injury -Creatinine is unable to assess because of high bilirubin level.  But clinically patient is making minimal urine output.  High risk of hepatorenal syndrome.  --had been on LR at 100 mill per hour  --reduce LR to 50 ml/hr --Foley for strict I/O  Essential hypertension -Blood pressure in low normal range.  Currently Home meds on hold including amlodipine, HCTZ and lisinopril.   --cont to hold BP meds    Hyponatremia -Likely hypervolemic hyponatremia related to portal hypertension, probably also contributed by low solute intake in last 4 to 5 days. --cont LR at reduced 50 ml/hr  Chronic alcoholism -Minimize use of IV Ativan to avoid further encephalopathy  Obesity, BMI 44   DVT prophylaxis: SCD/Compression stockings Code Status: DNR  Family Communication: palliative in communications with son Status is: inpatient Dispo:  The patient is from: home Anticipated d/c is to: undetermined Anticipated d/c date is: undetermined Patient currently is not medically stable to  d/c due to: decompensated cirrhosis, high risk of mortality, declined comfort care, currently still want full treatment.   Infusions:   cefTRIAXone (ROCEPHIN)  IV Stopped (04/06/21 2243)   lactated ringers 50 mL/hr at 04/07/21 1800    Scheduled Meds:  Chlorhexidine Gluconate Cloth  6 each Topical Daily   folic acid  1 mg Oral Daily   lactulose  20 g Oral BID   multivitamin with minerals  1 tablet Oral Daily   mupirocin ointment   Nasal BID   pantoprazole  40 mg Oral BID AC   predniSONE  40 mg Oral Q breakfast   thiamine  100 mg Oral Daily   Or   thiamine  100 mg Intravenous Daily    Antimicrobials: Anti-infectives (From admission, onward)    Start     Dose/Rate Route Frequency Ordered Stop   04/04/21 2300  cefTRIAXone (ROCEPHIN) 1 g in sodium chloride 0.9 % 100 mL IVPB        1 g 200 mL/hr over 30 Minutes Intravenous Every 24 hours 04/03/21 0035 04/08/21 2259   04/02/21 2315  cefTRIAXone (ROCEPHIN) 1 g in sodium chloride 0.9 % 100 mL IVPB        1 g 200 mL/hr over 30 Minutes Intravenous  Once 04/02/21 2313 04/02/21 2348       PRN meds: HYDROmorphone, ondansetron (ZOFRAN) IV   Objective: Vitals:   04/07/21 1727 04/07/21 1800  BP: 119/78 103/69  Pulse: 79 77  Resp: 14 14  Temp: 97.6 F (36.4 C)   SpO2: 94% 95%    Intake/Output Summary (Last 24 hours) at 04/07/2021 1906 Last data filed at 04/07/2021 1800 Gross per 24 hour  Intake 2433.08 ml  Output 1070 ml  Net 1363.08 ml   Filed Weights   04/02/21 2102  Weight: (!) 148 kg   Weight change:  Body mass index is 44.25 kg/m.   Physical Exam: Constitutional: NAD, somnolent, anasarca HEENT: conjunctivae icterus, lids normal CV: No cyanosis.   RESP: normal respiratory effort, on RA Extremities: edema in BLE SKIN: warm, dry Foley present with coffee-colored urine   Data Review: I have personally reviewed the laboratory data and studies available.  Recent Labs  Lab 04/03/21 1052 04/04/21 0411  04/05/21 0544 04/06/21 0521 04/07/21 0305  WBC 5.5 4.9 6.8 8.1 9.1  NEUTROABS  --  3.7 5.6 6.5 7.4  HGB 14.3 13.7 13.1 14.5 13.3  HCT 39.3 36.6* 35.6* 39.1 36.3*  MCV 101.8* 101.9* 100.8* 97.8 98.6  PLT 54* 55* 64* 59* 65*   Recent Labs  Lab 04/02/21 2108 04/03/21 1052 04/04/21 0411 04/05/21 0544 04/06/21 0521 04/07/21 0305  NA 126* 127* 128* 125* 126* 125*  K 2.8* 4.0 3.9 3.9 3.6 3.6  CL 88* 90* 92* 92* 93* 93*  CO2 21* $Remov'22 25 25 22 'JbuWcv$ 21*  GLUCOSE 132* 121* 107* 121* 129* 131*  BUN 42* 46* 55* 75* 91* 95*  CREATININE UNABLE TO REPORT DUE TO ICTERUS DLB UNABLE TO REPORT DUE TO ICTERUS UNABLE TO REPORT DUE TO ICTERUS UNABLE TO REPORT DUE TO ICTERUS UNABLE TO REPORT DUE TO ICTERUS UNABLE TO REPORT DUE TO ICTERUS  CALCIUM 8.4* 8.2* 8.0* 7.6* 7.8* 8.0*  MG 2.2  --  2.2  --   --   --   PHOS  --   --  UNABLE TO REPORT DUE TO ICTERUS  --   --   --  F/u labs ordered Unresulted Labs (From admission, onward)     Start     Ordered   04/05/21 1443  Varicella-zoster by PCR (Blood or Swab)  Add-on,   AD        04/05/21 1443            Signed, Enzo Bi, MD [facility] 04/07/2021

## 2021-04-07 NOTE — Progress Notes (Addendum)
Palliative: Oscar Poole is lying quietly in bed with nursing staff at bedside giving bath.  PMT to return.  PMT returns later in the day to find Oscar Poole lying quietly in bed.  He will briefly open his eyes when I asked, but not make or keep eye contact.  He appears acutely/chronically ill, morbidly obese.  He is clearly jaundiced with anasarca.  His belly is firm.  I am not sure that he can make his basic needs known.  There is no family at bedside at this time. Oscar Poole is oriented to person and place, but cannot tell me the month.  He has shared with nursing staff that his desire is to be comfortable.  We talked about his acute health issues.  Oscar Poole tells me that he does want to be comfortable, but not at the cost of his life.  He states that he would rather "suffer through them and be alive".  I reassure him that we are caring for him.  Call to son, Oscar Poole.  Left voicemail message.  Call to Stevie's wife, Oscar Poole. [phone].  We talked about Oscar Poole's acute health concerns, his frailty, our conversation this morning.  As we are talking, son Oscar Poole calls EN.  We conference a phone call.   I share our conversation with son, Oscar Poole desire to struggle on.  At this point, Oscar Poole shares that he would not want to go against his father's wishes.  Oscar Poole he does recognize that his father is not having meaningful improvements.  He shares that when his father is unresponsive, not aware, at that point he would like to focus on comfort care.  Oscar Poole he shares his experience of seeing someone die from liver failure/opiate overdose.  We talked about what comfort care looks like, providing medicines for comfort.  Oscar Poole states that he would like to be notified if his father is worsening so that he can be present for his passing.  I share that there may be sudden changes and staff would not be able to notify him, he states understanding.  Conference with attending, bedside nursing staff, transition of care team  related to patient condition, needs, goals of care.  Plan:   At this point continue to treat the treatable but no CPR or intubation.  Even though Oscar Poole is not decisional, he tells me that he is not ready for comfort care.  Surrogate decision Oscar Poole states that they would like to focus on comfort and dignity if/when Oscar Poole is no longer alert and able to communicate his wants/needs.  50 minutes  Lillia Carmel, NP Palliative medicine Team phone [phone] Greater than 50% of this time was spent counseling and coordinating care related to the above assessment and plan.

## 2021-04-08 DIAGNOSIS — B009 Herpesviral infection, unspecified: Secondary | ICD-10-CM

## 2021-04-08 DIAGNOSIS — K7200 Acute and subacute hepatic failure without coma: Secondary | ICD-10-CM | POA: Diagnosis not present

## 2021-04-08 DIAGNOSIS — Z7189 Other specified counseling: Secondary | ICD-10-CM | POA: Diagnosis not present

## 2021-04-08 DIAGNOSIS — Z515 Encounter for palliative care: Secondary | ICD-10-CM | POA: Diagnosis not present

## 2021-04-08 DIAGNOSIS — K7290 Hepatic failure, unspecified without coma: Secondary | ICD-10-CM

## 2021-04-08 DIAGNOSIS — K7201 Acute and subacute hepatic failure with coma: Secondary | ICD-10-CM

## 2021-04-08 DIAGNOSIS — N179 Acute kidney failure, unspecified: Secondary | ICD-10-CM

## 2021-04-08 LAB — CBC
HCT: 36.4 % — ABNORMAL LOW (ref 39.0–52.0)
Hemoglobin: 13.6 g/dL (ref 13.0–17.0)
MCH: 36.6 pg — ABNORMAL HIGH (ref 26.0–34.0)
MCHC: 37.4 g/dL — ABNORMAL HIGH (ref 30.0–36.0)
MCV: 97.8 fL (ref 80.0–100.0)
Platelets: 62 10*3/uL — ABNORMAL LOW (ref 150–400)
RBC: 3.72 MIL/uL — ABNORMAL LOW (ref 4.22–5.81)
RDW: 17.4 % — ABNORMAL HIGH (ref 11.5–15.5)
WBC: 11.2 10*3/uL — ABNORMAL HIGH (ref 4.0–10.5)
nRBC: 0 % (ref 0.0–0.2)

## 2021-04-08 LAB — PROTIME-INR
INR: 2.9 — ABNORMAL HIGH (ref 0.8–1.2)
Prothrombin Time: 29.9 seconds — ABNORMAL HIGH (ref 11.4–15.2)

## 2021-04-08 LAB — MAGNESIUM: Magnesium: 2.8 mg/dL — ABNORMAL HIGH (ref 1.7–2.4)

## 2021-04-08 LAB — COMPREHENSIVE METABOLIC PANEL
ALT: 97 U/L — ABNORMAL HIGH (ref 0–44)
AST: 179 U/L — ABNORMAL HIGH (ref 15–41)
Albumin: 1.8 g/dL — ABNORMAL LOW (ref 3.5–5.0)
Alkaline Phosphatase: 93 U/L (ref 38–126)
Anion gap: 14 (ref 5–15)
BUN: 106 mg/dL — ABNORMAL HIGH (ref 6–20)
CO2: 21 mmol/L — ABNORMAL LOW (ref 22–32)
Calcium: 7.9 mg/dL — ABNORMAL LOW (ref 8.9–10.3)
Chloride: 94 mmol/L — ABNORMAL LOW (ref 98–111)
Creatinine, Ser: UNDETERMINED mg/dL (ref 0.61–1.24)
Glucose, Bld: 122 mg/dL — ABNORMAL HIGH (ref 70–99)
Potassium: 3.5 mmol/L (ref 3.5–5.1)
Sodium: 129 mmol/L — ABNORMAL LOW (ref 135–145)
Total Bilirubin: 32.7 mg/dL (ref 0.3–1.2)
Total Protein: 6.7 g/dL (ref 6.5–8.1)

## 2021-04-08 LAB — PHOSPHORUS: Phosphorus: UNDETERMINED mg/dL (ref 2.5–4.6)

## 2021-04-08 MED ORDER — ALBUMIN HUMAN 25 % IV SOLN
12.5000 g | Freq: Two times a day (BID) | INTRAVENOUS | Status: DC
  Administered 2021-04-08 – 2021-04-12 (×9): 12.5 g via INTRAVENOUS
  Filled 2021-04-08 (×9): qty 50

## 2021-04-08 MED ORDER — VITAMIN K1 10 MG/ML IJ SOLN
10.0000 mg | Freq: Every day | INTRAMUSCULAR | Status: AC
  Administered 2021-04-08 – 2021-04-10 (×3): 10 mg via SUBCUTANEOUS
  Filled 2021-04-08 (×3): qty 1

## 2021-04-08 NOTE — Consult Note (Signed)
NAME: Oscar Poole  DOB: [birth year]  MRN: [id number]  Date/Time: 04/08/2021 2:56 PM  REQUESTING PROVIDER: Dr. Fran Lowes Subjective:  REASON FOR CONSULT: Rule out HSV hepatitis. No history available from patient.  Chart reviewed. Oscar Poole is a 60 y.o. male with  a history of Hypertension, hepatitis, opioid use disorder on Suboxone, alcohol use disorder Presents to the ED via EMS on 04/02/2021 with inability to pass urine for 5 days and trying to use a family members In-N-Out catheter, jaundiced, abdominal pain, and fatigue. In the ED he was found to be extremely jaundiced with BP 178/133, temp 97.8, respiratory rate 24, sats 98%, pulse 88.  Labs revealed a WBC 5.2, Hb 15.1, platelet 97 and creatinine was unable to be reported because of extreme icterus.  Total bilirubin was 22 on admission with lipase of 342, AST of 147, ALT of 64 and ammonia of 50.  Sodium was 126, potassium 2.8, glucose 132, BUN 42. CT abdomen revealed hepatic cirrhosis, splenomegaly, inflammation near the duodenum questioning pancreatitis.  UA showed WBC of 21-50, RBC 21-50.Marland Kitchen    Patient was seen by GI and started on prednisone for a discriminant score of 100.  He is also on lactulose.  He has been started on ceftriaxone for SBP prophylaxis. As part of the work-up for liver failure HSV, VZV, hepatitis B and CMV were all sent.  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  And I am asked to see the patient to r/o acute HSV hepatitis . Past Medical History:  Diagnosis Date   Alcoholic (HCC)    Back pain    Hepatitis B    Hepatitis C     Past Surgical History:  Procedure Laterality Date   EYE SURGERY     HERNIA REPAIR      Social History   Socioeconomic History   Marital status: Legally Separated    Spouse name: Not on file   Number of children: Not on file   Years of education: Not on file   Highest education level: Not on file  Occupational History   Not on file  Tobacco Use   Smoking status: Never   Smokeless tobacco: Never   Substance and Sexual Activity   Alcohol use: Yes    Alcohol/week: 0.0 standard drinks    Comment: 6 10% alcohol beers per day   Drug use: Not Currently   Sexual activity: Never  Other Topics Concern   Not on file  Social History Narrative   Not on file   Social Determinants of Health   Financial Resource Strain: Not on file  Food Insecurity: Not on file  Transportation Needs: Not on file  Physical Activity: Not on file  Stress: Not on file  Social Connections: Not on file  Intimate Partner Violence: Not on file    Family History  Problem Relation Age of Onset   Emphysema Mother 76   Heart attack Father 55   Heart attack Brother 42   Prostate cancer Neg Hx    Kidney cancer Neg Hx    Bladder Cancer Neg Hx    No Known Allergies I? Current Facility-Administered Medications  Medication Dose Route Frequency Provider Last Rate Last Admin   albumin human 25 % solution 12.5 g  12.5 g Intravenous BID Darlin Priestly, MD   Stopped at 04/08/21 1302   Chlorhexidine Gluconate Cloth 2 % PADS 6 each  6 each Topical Daily Dahal, Melina Schools, MD   6 each at 04/08/21 0600   folic acid (  FOLVITE) tablet 1 mg  1 mg Oral Daily Tu, Ching T, DO   1 mg at 04/08/21 1046   HYDROmorphone (DILAUDID) tablet 2 mg  2 mg Oral Q6H PRN Darlin Priestly, MD   2 mg at 04/08/21 1052   lactated ringers infusion   Intravenous Continuous Darlin Priestly, MD 50 mL/hr at 04/08/21 1400 Infusion Verify at 04/08/21 1400   lactulose (CHRONULAC) 10 GM/15ML solution 20 g  20 g Oral BID Darlin Priestly, MD   20 g at 04/08/21 1045   multivitamin with minerals tablet 1 tablet  1 tablet Oral Daily Tu, Ching T, DO   1 tablet at 04/08/21 1046   mupirocin ointment (BACTROBAN) 2 %   Nasal BID Lorin Glass, MD   Given at 04/08/21 0814   ondansetron (ZOFRAN) injection 4 mg  4 mg Intravenous Q6H PRN Dahal, Melina Schools, MD   4 mg at 04/06/21 1527   pantoprazole (PROTONIX) EC tablet 40 mg  40 mg Oral BID AC Midge Minium, MD   40 mg at 04/08/21 1046   phytonadione  (VITAMIN K) SQ injection 10 mg  10 mg Subcutaneous Daily [name], MD   10 mg at 04/08/21 1157   predniSONE (DELTASONE) tablet 40 mg  40 mg Oral Q breakfast Tu, Ching T, DO   40 mg at 04/08/21 1046   thiamine tablet 100 mg  100 mg Oral Daily Tu, Ching T, DO   100 mg at 04/07/21 1023   Or   thiamine (B-1) injection 100 mg  100 mg Intravenous Daily Tu, Ching T, DO   100 mg at 04/08/21 0626     Abtx:  Anti-infectives (From admission, onward)    Start     Dose/Rate Route Frequency Ordered Stop   04/04/21 2300  cefTRIAXone (ROCEPHIN) 1 g in sodium chloride 0.9 % 100 mL IVPB        1 g 200 mL/hr over 30 Minutes Intravenous Every 24 hours 04/03/21 0035 04/08/21 0110   04/02/21 2315  cefTRIAXone (ROCEPHIN) 1 g in sodium chloride 0.9 % 100 mL IVPB        1 g 200 mL/hr over 30 Minutes Intravenous  Once 04/02/21 2313 04/02/21 2348       REVIEW OF SYSTEMS:  NA: Objective:  VITALS:  BP 107/74   Pulse 81   Temp 97.9 F (36.6 C) (Oral)   Resp (!) 22   Ht 6' (1.829 m)   Wt (!) 148 kg   SpO2 93%   BMI 44.25 kg/m  PHYSICAL EXAM:  General: somnolent secondary to the dilaudid he has received a few times today Opens eyes on calling his name and mumbles Head: Normocephalic, without obvious abnormality, atraumatic. Eyes: Conjunctivae clear, icteric sclerae. Pupils are equal ENT cannot assess Neck: , symmetrical, no adenopathy, thyroid: non tender no carotid bruit and no JVD.  Lungs: b/l air entry. Heart: s1s2 Abdomen: distended Extremities: edematous Skin:icteric Spider nevi chest wall Lymph: Cervical, supraclavicular normal. Neurologic: cannot assess Pertinent Labs Lab Results CBC    Component Value Date/Time   WBC 11.2 (H) 04/08/2021 0319   RBC 3.72 (L) 04/08/2021 0319   HGB 13.6 04/08/2021 0319   HGB 15.5 01/25/2012 0041   HCT 36.4 (L) 04/08/2021 0319   HCT 46.3 01/25/2012 0041   PLT 62 (L) 04/08/2021 0319   PLT 180 01/25/2012 0041   MCV 97.8 04/08/2021 0319   MCV 96  01/25/2012 0041   MCH 36.6 (H) 04/08/2021 0319   MCHC 37.4 (H) 04/08/2021  0319   RDW 17.4 (H) 04/08/2021 0319   RDW 13.4 01/25/2012 0041   LYMPHSABS 0.5 (L) 04/07/2021 0305   MONOABS 0.8 04/07/2021 0305   EOSABS 0.1 04/07/2021 0305   BASOSABS 0.0 04/07/2021 0305    CMP Latest Ref Rng & Units 04/08/2021 04/07/2021 04/06/2021  Glucose 70 - 99 mg/dL 629(B122(H) 284(X131(H) 324(M129(H)  BUN 6 - 20 mg/dL 010(U106(H) 72(Z95(H) 36(U91(H)  Creatinine 0.61 - 1.24 mg/dL UNABLE TO REPORT DUE TO ICTERUS UNABLE TO REPORT DUE TO ICTERUS UNABLE TO REPORT DUE TO ICTERUS  Sodium 135 - 145 mmol/L 129(L) 125(L) 126(L)  Potassium 3.5 - 5.1 mmol/L 3.5 3.6 3.6  Chloride 98 - 111 mmol/L 94(L) 93(L) 93(L)  CO2 22 - 32 mmol/L 21(L) 21(L) 22  Calcium 8.9 - 10.3 mg/dL 7.9(L) 8.0(L) 7.8(L)  Total Protein 6.5 - 8.1 g/dL 6.7 6.7 6.9  Total Bilirubin 0.3 - 1.2 mg/dL 32.7(HH) 30.3(HH) NOT VALID  Alkaline Phos 38 - 126 U/L 93 88 81  AST 15 - 41 U/L 179(H) 172(H) 167(H)  ALT 0 - 44 U/L 97(H) 89(H) 83(H)      Microbiology: Recent Results (from the past 240 hour(s))  Urine Culture     Status: Abnormal   Collection Time: 04/02/21  9:07 PM   Specimen: Urine, Random  Result Value Ref Range Status   Specimen Description   Final    URINE, RANDOM Performed at [facility], [address] [place], [place] [address]    Special Requests   Final    NONE Performed at [facility], [address] [place], [place] [address]    Culture 80,000 COLONIES/mL STAPHYLOCOCCUS EPIDERMIDIS (A)  Final   Report Status 04/05/2021 FINAL  Final   Organism ID, Bacteria STAPHYLOCOCCUS EPIDERMIDIS (A)  Final      Susceptibility   Staphylococcus epidermidis - MIC*    CIPROFLOXACIN <=0.5 SENSITIVE Sensitive     GENTAMICIN <=0.5 SENSITIVE Sensitive     NITROFURANTOIN <=16 SENSITIVE Sensitive     OXACILLIN >=4 RESISTANT Resistant     TETRACYCLINE 2 SENSITIVE Sensitive     VANCOMYCIN <=0.5 SENSITIVE Sensitive     TRIMETH/SULFA 20 SENSITIVE  Sensitive     CLINDAMYCIN <=0.25 SENSITIVE Sensitive     RIFAMPIN <=0.5 SENSITIVE Sensitive     Inducible Clindamycin NEGATIVE Sensitive     * 80,000 COLONIES/mL STAPHYLOCOCCUS EPIDERMIDIS  SARS CORONAVIRUS 2 (TAT 6-24 HRS) Nasopharyngeal Nasopharyngeal Swab     Status: None   Collection Time: 04/02/21  9:51 PM   Specimen: Nasopharyngeal Swab  Result Value Ref Range Status   SARS Coronavirus 2 NEGATIVE NEGATIVE Final    Comment: (NOTE) SARS-CoV-2 target nucleic acids are NOT DETECTED.  The SARS-CoV-2 RNA is generally detectable in upper and lower respiratory specimens during the acute phase of infection. Negative results do not preclude SARS-CoV-2 infection, do not rule out co-infections with other pathogens, and should not be used as the sole basis for treatment or other patient management decisions. Negative results must be combined with clinical observations, patient history, and epidemiological information. The expected result is Negative.  Fact Sheet for Patients: HairSlick.nohttps://www.fda.gov/media/138098/download  Fact Sheet for Healthcare Providers: quierodirigir.comhttps://www.fda.gov/media/138095/download  This test is not yet approved or cleared by the [place] FDA and  has been authorized for detection and/or diagnosis of SARS-CoV-2 by FDA under an Emergency Use Authorization (EUA). This EUA will remain  in effect (meaning this test can be used) for the duration of the COVID-19 declaration under Se ction 564(b)(1) of  the Act, 21 U.S.C. section 360bbb-3(b)(1), unless the authorization is terminated or revoked sooner.  Performed at [facility], [address], [place], [place] [address]   MRSA Next Gen by PCR, Nasal     Status: Abnormal   Collection Time: 04/03/21  6:37 AM   Specimen: Nasal Mucosa; Nasal Swab  Result Value Ref Range Status   MRSA by PCR Next Gen DETECTED (A) NOT DETECTED Final    Comment: RESULT CALLED TO, READ BACK BY AND VERIFIED WITH: Fabian Sharp AT  3007 04/03/21.PMF (NOTE) The GeneXpert MRSA Assay (FDA approved for NASAL specimens only), is one component of a comprehensive MRSA colonization surveillance program. It is not intended to diagnose MRSA infection nor to guide or monitor treatment for MRSA infections. Test performance is not FDA approved in patients less than 78 years old. Performed at [facility], [address] [place], [place] [address]     IMAGING RESULTS: Ct abdomen reviewed Fat stranding and inflammatory changes adjacent to the distal duodenum and uncinate process of the pancreas, favored to be duodenal in origin rather than pancreatic. Peptic ulcer disease or duodenitis are considered. Recommend correlation with pancreatic enzymes. 2. Hepatic cirrhosis with portal hypertension. Splenomegaly and portosystemic collaterals. Small volume abdominopelvic ascites. 3. Trace left pleural effusion.  I have personally reviewed the films ? Impression/Recommendation ? Acute on chronic liver failure secondary to decompensated liver disease from alcohol use.  He has a history of positive hepatitis C.  We will get a PCR to see what the value is. Not sure why he decompensated. As part of the work-up for viral hepatitis HSV IgM was tested by GI and it is minimally elevated at 1.7. Not sure whether he has  acute HSV infection.  At your infection is more common in people or immunocompromised with HIV or malignancy or transplant or chemo.  We will send HSV PCR.  If positive then we could think of treating with acyclovir.  At the current moment there risk of acyclovir administration outweighs the benefit.  Patient is somnolent from Dilaudid use. He also has increasing pneumonia. Would avoid opioids.  Patient had no urinary output for 5 days. Unable to calculate creatinine because of high bilirubin to see whether he has CKD or AKI.  Patient is on oral prednisone secondary to acute alcohol hepatitis.  Increased ammonia.   On lactulose  On ceftriaxone for SBP prophylaxis  Severe obesity  Constipation Discussed with care team.  Note:  This document was prepared using Dragon voice recognition software and may include unintentional dictation errors.

## 2021-04-08 NOTE — Progress Notes (Signed)
Palliative:  Oscar Poole is lying quietly in bed.  He wakes when I call his name, but will not make eye contact.  He is able to tell me his name, but not where we are. He has not shown meaningful improvement.  He is unable to make his basic needs known.   Call to son, healthcare surrogate, Oscar Poole.  We talked about Oscar Poole's acute and chronic health concerns.  He has had no meaningful improvements, but no real declines either.  At this point stay the course.  Conference with attending, bedside nursing staff, transition of care team related to patient condition, needs, goals of care.  Plan:   At this point continue to treat the treatable but no CPR or intubation.  Even though Oscar Poole is not decisional, he tells me that he is not ready for comfort care.  Surrogate decision Oscar Poole states that they would like to focus on comfort and dignity if/when Oscar Poole is no longer alert and able to communicate his wants/needs.  20 minutes  Lillia Carmel, NP Palliative Medicine Team  Team Phone [phone] Greater than 50% of this time was spent counseling and coordinating care related to the above assessment and plan.

## 2021-04-08 NOTE — Plan of Care (Signed)
Pt on room air this shift w/ stable VS, minimal UOP that is amber to [place] and cloudy, pt does awaken to voice, swallows effectively as long as he is completely awake, took his meds with applesauce and his lactulose from a spoon, disoriented to time, and also place and situation at times, he does follow commands and requests fluids and pain medication verbally.  Jaundice and edema pronounced and continuing, appetite is very poor, pt exhibits no interest in food, he states he no longer wants to take suboxone  "because it binds me up." ABD is fairly distended w/ active bowel sounds.

## 2021-04-08 NOTE — Progress Notes (Signed)
Oscar Poole , MD [address], [place], [place], [address] [address], [place], [place], [address] Phone: [phone]  Fax: [phone]   Oscar Poole is being followed for acute alcoholic hepatitis and liver failure   Subjective: More drowsy, no complaints    Objective: Vital signs in last 24 hours: Vitals:   04/08/21 0600 04/08/21 0700 04/08/21 0800 04/08/21 0900  BP: 108/68 95/62 108/69   Pulse: 83 76 82 80  Resp: 17 11 16 13   Temp:   97.9 F (36.6 C)   TempSrc:   Oral   SpO2: 93% 93% 95% 93%  Weight:      Height:       Weight change:   Intake/Output Summary (Last 24 hours) at 04/08/2021 0955 Last data filed at 04/08/2021 0800 Gross per 24 hour  Intake 1215.57 ml  Output 940 ml  Net 275.57 ml     Exam: Heart:: Regular rate and rhythm, S1S2 present, or without murmur or extra heart sounds Lungs: normal Abdomen: soft, nontender, normal bowel sounds   Lab Results: @LABTEST2 @ Micro Results: Recent Results (from the past 240 hour(s))  Urine Culture     Status: Abnormal   Collection Time: 04/02/21  9:07 PM   Specimen: Urine, Random  Result Value Ref Range Status   Specimen Description   Final    URINE, RANDOM Performed at [facility], [address] [place], [address] [place]    Special Requests   Final    NONE Performed at [facility], [address] [place], [address] [place]    Culture 80,000 COLONIES/mL STAPHYLOCOCCUS EPIDERMIDIS (A)  Final   Report Status 04/05/2021 FINAL  Final   Organism ID, Bacteria STAPHYLOCOCCUS EPIDERMIDIS (A)  Final      Susceptibility   Staphylococcus epidermidis - MIC*    CIPROFLOXACIN <=0.5 SENSITIVE Sensitive     GENTAMICIN <=0.5 SENSITIVE Sensitive     NITROFURANTOIN <=16 SENSITIVE Sensitive     OXACILLIN >=4 RESISTANT Resistant     TETRACYCLINE 2 SENSITIVE Sensitive     VANCOMYCIN <=0.5 SENSITIVE Sensitive     TRIMETH/SULFA 20 SENSITIVE Sensitive     CLINDAMYCIN  <=0.25 SENSITIVE Sensitive     RIFAMPIN <=0.5 SENSITIVE Sensitive     Inducible Clindamycin NEGATIVE Sensitive     * 80,000 COLONIES/mL STAPHYLOCOCCUS EPIDERMIDIS  SARS CORONAVIRUS 2 (TAT 6-24 HRS) Nasopharyngeal Nasopharyngeal Swab     Status: None   Collection Time: 04/02/21  9:51 PM   Specimen: Nasopharyngeal Swab  Result Value Ref Range Status   SARS Coronavirus 2 NEGATIVE NEGATIVE Final    Comment: (NOTE) SARS-CoV-2 target nucleic acids are NOT DETECTED.  The SARS-CoV-2 RNA is generally detectable in upper and lower respiratory specimens during the acute phase of infection. Negative results do not preclude SARS-CoV-2 infection, do not rule out co-infections with other pathogens, and should not be used as the sole basis for treatment or other patient management decisions. Negative results must be combined with clinical observations, patient history, and epidemiological information. The expected result is Negative.  Fact Sheet for Patients: 04/07/2021  Fact Sheet for Healthcare Providers: 06/02/21  This test is not yet approved or cleared by the [employer] FDA and  has been authorized for detection and/or diagnosis of SARS-CoV-2 by FDA under an Emergency Use Authorization (EUA). This EUA will remain  in effect (meaning this test can be used) for the duration of the COVID-19 declaration under Se ction 564(b)(1) of the Act,  21 U.S.C. section 360bbb-3(b)(1), unless the authorization is terminated or revoked sooner.  Performed at [facility], [address] [place], [place] [address]   MRSA Next Gen by PCR, Nasal     Status: Abnormal   Collection Time: 04/03/21  6:37 AM   Specimen: Nasal Mucosa; Nasal Swab  Result Value Ref Range Status   MRSA by PCR Next Gen DETECTED (A) NOT DETECTED Final    Comment: RESULT CALLED TO, READ BACK BY AND VERIFIED WITH: Fabian Sharp AT 6010  04/03/21.PMF (NOTE) The GeneXpert MRSA Assay (FDA approved for NASAL specimens only), is one component of a comprehensive MRSA colonization surveillance program. It is not intended to diagnose MRSA infection nor to guide or monitor treatment for MRSA infections. Test performance is not FDA approved in patients less than 76 years old. Performed at [facility], [address], [place], [place] [address]    Studies/Results: No results found. Medications: I have reviewed the patient's current medications. Scheduled Meds:  Chlorhexidine Gluconate Cloth  6 each Topical Daily   folic acid  1 mg Oral Daily   lactulose  20 g Oral BID   multivitamin with minerals  1 tablet Oral Daily   mupirocin ointment   Nasal BID   pantoprazole  40 mg Oral BID AC   predniSONE  40 mg Oral Q breakfast   thiamine  100 mg Oral Daily   Or   thiamine  100 mg Intravenous Daily   Continuous Infusions:  lactated ringers 50 mL/hr at 04/08/21 0800   PRN Meds:.HYDROmorphone, ondansetron (ZOFRAN) IV   Assessment: Principal Problem:   Liver failure without hepatic coma (HCC) Active Problems:   Alcohol intoxication (HCC)   Hypokalemia   Acute renal failure (HCC)   Acute pancreatitis   Hypotension   Hyponatremia   Alcohol abuse   Ascites   Bilirubinemia  Oscar Poole 60 y.o. male opioid abuse with a history of chronic alcohol and opioid use, hepatitis C presented to the emergency room with abdominal pain and decreased urine output on 04/02/2021.  Had elevated discomfort and function seen and assessed by Dr. Servando Snare on 04/03/2021, commenced on steroids for alcoholic hepatitis.  CT scan of the abdomen demonstrated fat stranding and inflammatory changes adjacent to distal duodenum favored to be duodenal in origin.  Possible duodenitis.  Features of hepatic cirrhosis and portal hypertension noted mild wall thickening of the ascending colon bilateral inguinal hernias and hiatal hernia noted.  Found to be in  acute liver failure.  Recommended palliative care.  Urine in his bag appears very concentrated.  Long discussion with the son and patient that with possible liver and renal failure very high associated mortality and very important that goals of care be addressed.  Ascites was not sufficient to be tapped.  INR 04/06/2021 2.5, total bilirubin is rising 32.7.  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    Plan: 1.  Continue supportive care the mainstay of therapy for alcoholic hepatitis is to avoid alcohol intake and good nutrition.  Input from dietitian would help. 2.   If the patient recovers from this episode will need an EGD as an outpatient to screen for esophageal varices 4.  If HSV IgM is positive which could indicate recent infection and at times is implicated in the etiology of acute liver failure which can occur in conjunction with acute alcoholic hepatitis.  We are unable to view his creatinine and hence it would be difficult to recommend  therapy I would suggest input from ID as well as nephrology if a antiviral therapy is adjusted and if yes appropriate dosing based on renal function. 5.  Continue lactulose 6.  Avoid opiates, benzodiazepines as it could precipitate hepatic encephalopathy 7.  Check INR 8. Vitamin K 10 mg daily for 3 days S.C- I will place order  9. Overall poor prognosis  10 Hepatic encephelopathy , Will add xifaxan    I have discussed the plan with Dr Fran Lowes     LOS: 5 days   Oscar Mood, MD 04/08/2021, 9:55 AM

## 2021-04-08 NOTE — TOC Progression Note (Signed)
Transition of Care [facility]) - Progression Note    Patient Details  Name: URA HAUSEN MRN: [id number] Date of Birth: 07-Jan-1961  Transition of Care [facility]) CM/SW Contact  Marina Goodell Phone Number: [phone] 04/08/2021, 4:05 PM  Clinical Narrative:     Patient remains critically ill with only self-orientation, remains the same as yesterday. Patient's HCPOA Burnie, Therien (Son) [phone] [facility]) would like for treatment to remain as it is. Patient is DNR.  Expected Discharge Plan: Home w Home Health Services Barriers to Discharge: Continued Medical Work up  Expected Discharge Plan and Services Expected Discharge Plan: Home w Home Health Services In-house Referral: Clinical Social Work, Hospice / Palliative Care   Post Acute Care Choice: Home Health Living arrangements for the past 2 months: Mobile Home                                       Social Determinants of Health (SDOH) Interventions    Readmission Risk Interventions No flowsheet data found.

## 2021-04-08 NOTE — Progress Notes (Signed)
PROGRESS NOTE  Oscar Poole  DOB: 1961/06/15  PCP: Mendel Ryder, MD PJA:[id number]  DOA: 04/02/2021  LOS: 5 days  Hospital Day: 7   Chief Complaint  Patient presents with   Abdominal Pain    Brief narrative: Oscar Poole is a 60 y.o. male with PMH significant for chronic hepatitis C, history of alcohol and opiate use on Suboxone, hypertension. Patient presented to the ED on 8/12 with complaint of worsening abdominal pain, constipation and decreased urine output. Abdominal pain diffuse for 4 to 5 days associated with nausea vomiting, no bowel movement in 4 to 5 days.  Because of constipation for the duration, he stopped Suboxone.  No urine output in 4 to 5 days. Reports drinking about 5-6 cocktails daily but is unable to do so for the last 5 days because of which he started having tremors.  In the ED, patient was afebrile, blood pressure was initially elevated 278/133 but later on dipped down to 89/62 in few hours Labs with significant abnormality including sodium low at 126, potassium low at 2.8, lipase elevated to 342, AST/ALT/alk phos at 147/64/108, total bilirubin elevated to 22 with direct component significantly elevated to 18, creatinine was unable to calculate because of high level of bilirubin CBC with hemoglobin 15, MCV 102, platelet 97, INR 2.6 Tylenol level less than 10.1.  CT abdomen pelvis showed concern of duodenitis, evidence of hepatic cirrhosis with portal hypertension, small volume ascites.  CBD not well defined but no evidence of biliary dilatation. Admitted to hospitalist service GI was consulted. See below for details  Subjective: Pt was somnolent most of the time.  Would open eyes to voice, but then fell back to sleep right away.  Not awake long enough for significant oral intake.  RN reported pt complaining of pain when he woke up.  Too somnolent to increase opioids pain meds.  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  Patient's clinical and laboratory status worsening. -His prognosis is very poor.  This has been shared with the family multiple times.   -GI consult appreciated.   -Patient does not have enough ascites to tap.  Completed SBP ppx with ceftriaxone. Plan: --cont prednisone for alcoholic hepatitis --start IV albumin 12.5g BID, per GI rec  Hepatic encephalopathy --ammonia 105.  Started on aggressive lactulose.  Having excessive diarrhea 8/17, so lactulose tapered down --cont lactulose 20g BID  Acute pancreatitis/duodenitis -Presented with progressively worsening abdominal pain in the setting of heavy alcohol use -Lipase level elevated, liver enzymes elevated -CT abdomen findings are more suggestive of duodenitis and pancreatitis. -Patient was started on conservative management with IV fluid, IV albumin, pain medicines, antiemetics and PPI Plan: --oral dilaudid 2 mg q6h PRN --cont LR_0   Acute kidney injury -Creatinine is unable to assess because of high bilirubin level.  But clinically patient is making minimal urine output.  High risk of hepatorenal syndrome.  --had been on LR at 100 mill per hour  --cont LR_1  --Foley for strict I/O  Essential hypertension -Blood pressure in low normal range.  Currently Home meds on hold including amlodipine, HCTZ and lisinopril.   --cont to hold BP meds    Hyponatremia -Likely hypervolemic hyponatremia related to portal hypertension, probably also contributed by low solute intake in last 4 to 5 days. --cont LR_2   Chronic alcoholism -Minimize use of IV Ativan to avoid further encephalopathy  Obesity, BMI 44   DVT  prophylaxis: SCD/Compression stockings Code Status: DNR  Family Communication: palliative in communications with son Status is: inpatient Dispo:   The patient is from: home Anticipated d/c is to: undetermined Anticipated d/c date  is: undetermined Patient currently is not medically stable to d/c due to: decompensated cirrhosis, high risk of mortality, declined comfort care, currently still want full treatment.   Infusions:   albumin human Stopped (04/08/21 1302)   lactated ringers 50 mL/hr at 04/08/21 1627    Scheduled Meds:  Chlorhexidine Gluconate Cloth  6 each Topical Daily   folic acid  1 mg Oral Daily   lactulose  20 g Oral BID   multivitamin with minerals  1 tablet Oral Daily   mupirocin ointment   Nasal BID   pantoprazole  40 mg Oral BID AC   phytonadione  10 mg Subcutaneous Daily   predniSONE  40 mg Oral Q breakfast   thiamine  100 mg Oral Daily   Or   thiamine  100 mg Intravenous Daily    Antimicrobials: Anti-infectives (From admission, onward)    Start     Dose/Rate Route Frequency Ordered Stop   04/04/21 2300  cefTRIAXone (ROCEPHIN) 1 g in sodium chloride 0.9 % 100 mL IVPB        1 g 200 mL/hr over 30 Minutes Intravenous Every 24 hours 04/03/21 0035 04/08/21 0110   04/02/21 2315  cefTRIAXone (ROCEPHIN) 1 g in sodium chloride 0.9 % 100 mL IVPB        1 g 200 mL/hr over 30 Minutes Intravenous  Once 04/02/21 2313 04/02/21 2348       PRN meds: HYDROmorphone, ondansetron (ZOFRAN) IV   Objective: Vitals:   04/08/21 1600 04/08/21 1700  BP:  115/76  Pulse: 83 82  Resp: 15 (!) 25  Temp:  97.9 F (36.6 C)  SpO2: 91% 91%    Intake/Output Summary (Last 24 hours) at 04/08/2021 1821 Last data filed at 04/08/2021 1600 Gross per 24 hour  Intake 1130.48 ml  Output 665 ml  Net 465.48 ml   Filed Weights   04/02/21 2102  Weight: (!) 148 kg   Weight change:  Body mass index is 44.25 kg/m.   Physical Exam: Constitutional: sleeping, opened eyes to voice, but quickly went back to seelp HEENT: conjunctivae icterus, and lids normal CV: No cyanosis.   RESP: normal respiratory effort, on RA Extremities: edema in BLE SKIN: warm, dry Foley present, with tea-colored urine   Data Review: I  have personally reviewed the laboratory data and studies available.  Recent Labs  Lab 04/04/21 0411 04/05/21 0544 04/06/21 0521 04/07/21 0305 04/08/21 0319  WBC 4.9 6.8 8.1 9.1 11.2*  NEUTROABS 3.7 5.6 6.5 7.4  --   HGB 13.7 13.1 14.5 13.3 13.6  HCT 36.6* 35.6* 39.1 36.3* 36.4*  MCV 101.9* 100.8* 97.8 98.6 97.8  PLT 55* 64* 59* 65* 62*   Recent Labs  Lab 04/02/21 2108 04/03/21 1052 04/04/21 0411 04/05/21 0544 04/06/21 0521 04/07/21 0305 04/08/21 0319  NA 126*   < > 128* 125* 126* 125* 129*  K 2.8*   < > 3.9 3.9 3.6 3.6 3.5  CL 88*   < > 92* 92* 93* 93* 94*  CO2 21*   < > _0 21* 21*  GLUCOSE 132*   < > 107* 121* 129* 131* 122*  BUN 42*   < > 55* 75* 91* 95* 106*  CREATININE UNABLE TO REPORT DUE TO ICTERUS DLB   < > UNABLE TO  REPORT DUE TO ICTERUS UNABLE TO REPORT DUE TO ICTERUS UNABLE TO REPORT DUE TO ICTERUS UNABLE TO REPORT DUE TO ICTERUS UNABLE TO REPORT DUE TO ICTERUS  CALCIUM 8.4*   < > 8.0* 7.6* 7.8* 8.0* 7.9*  MG 2.2  --  2.2  --   --   --  2.8*  PHOS  --   --  UNABLE TO REPORT DUE TO ICTERUS  --   --   --  UNABLE TO REPORT DUE TO ICTERUS   < > = values in this interval not displayed.    F/u labs ordered Unresulted Labs (From admission, onward)     Start     Ordered   04/08/21 1404  Herpes simplex virus (HSV), DNA by PCR Blood  Once,   R        04/08/21 1404   04/08/21 0500  CBC  Daily,   R     Question:  Specimen collection method  Answer:  Lab=Lab collect   04/07/21 1907   04/08/21 0500  Comprehensive metabolic panel  Daily,   R     Question:  Specimen collection method  Answer:  Lab=Lab collect   04/07/21 1907   04/08/21 0500  Magnesium  Daily,   R     Question:  Specimen collection method  Answer:  Lab=Lab collect   04/07/21 1907   04/08/21 0500  Phosphorus  Daily,   R     Question:  Specimen collection method  Answer:  Lab=Lab collect   04/07/21 1907   04/05/21 1443  Varicella-zoster by PCR (Blood or Swab)  Add-on,   AD        04/05/21 1443             Signed, Enzo Bi, MD [facility] 04/08/2021

## 2021-04-09 DIAGNOSIS — K7031 Alcoholic cirrhosis of liver with ascites: Secondary | ICD-10-CM

## 2021-04-09 DIAGNOSIS — K7200 Acute and subacute hepatic failure without coma: Secondary | ICD-10-CM | POA: Diagnosis not present

## 2021-04-09 DIAGNOSIS — Z515 Encounter for palliative care: Secondary | ICD-10-CM | POA: Diagnosis not present

## 2021-04-09 DIAGNOSIS — Z7189 Other specified counseling: Secondary | ICD-10-CM | POA: Diagnosis not present

## 2021-04-09 LAB — CBC
HCT: 36.4 % — ABNORMAL LOW (ref 39.0–52.0)
Hemoglobin: 13.5 g/dL (ref 13.0–17.0)
MCH: 36.4 pg — ABNORMAL HIGH (ref 26.0–34.0)
MCHC: 37.1 g/dL — ABNORMAL HIGH (ref 30.0–36.0)
MCV: 98.1 fL (ref 80.0–100.0)
Platelets: 48 10*3/uL — ABNORMAL LOW (ref 150–400)
RBC: 3.71 MIL/uL — ABNORMAL LOW (ref 4.22–5.81)
RDW: 17.3 % — ABNORMAL HIGH (ref 11.5–15.5)
WBC: 11.7 10*3/uL — ABNORMAL HIGH (ref 4.0–10.5)
nRBC: 0 % (ref 0.0–0.2)

## 2021-04-09 LAB — COMPREHENSIVE METABOLIC PANEL
ALT: 102 U/L — ABNORMAL HIGH (ref 0–44)
AST: 176 U/L — ABNORMAL HIGH (ref 15–41)
Albumin: 1.8 g/dL — ABNORMAL LOW (ref 3.5–5.0)
Alkaline Phosphatase: 96 U/L (ref 38–126)
Anion gap: 11 (ref 5–15)
BUN: 121 mg/dL — ABNORMAL HIGH (ref 6–20)
CO2: 22 mmol/L (ref 22–32)
Calcium: 8.1 mg/dL — ABNORMAL LOW (ref 8.9–10.3)
Chloride: 96 mmol/L — ABNORMAL LOW (ref 98–111)
Creatinine, Ser: UNDETERMINED mg/dL (ref 0.61–1.24)
Glucose, Bld: 128 mg/dL — ABNORMAL HIGH (ref 70–99)
Potassium: 3.5 mmol/L (ref 3.5–5.1)
Sodium: 129 mmol/L — ABNORMAL LOW (ref 135–145)
Total Bilirubin: 29.3 mg/dL (ref 0.3–1.2)
Total Protein: 6.5 g/dL (ref 6.5–8.1)

## 2021-04-09 LAB — MAGNESIUM: Magnesium: 3 mg/dL — ABNORMAL HIGH (ref 1.7–2.4)

## 2021-04-09 NOTE — Progress Notes (Signed)
Date of Admission:  04/02/2021      ID: Oscar Poole is a 60 y.o. male  Principal Problem:   Liver failure without hepatic coma (HCC) Active Problems:   Alcohol intoxication (HCC)   Hypokalemia   Acute renal failure (HCC)   Acute pancreatitis   Hypotension   Hyponatremia   Alcohol abuse   Ascites   Bilirubinemia    Subjective: Patient is a little more verbal today Little more alert Asking for Dilaudid But still somnolent Medications:   Chlorhexidine Gluconate Cloth  6 each Topical Daily   folic acid  1 mg Oral Daily   lactulose  20 g Oral BID   multivitamin with minerals  1 tablet Oral Daily   mupirocin ointment   Nasal BID   pantoprazole  40 mg Oral BID AC   phytonadione  10 mg Subcutaneous Daily   predniSONE  40 mg Oral Q breakfast   thiamine  100 mg Oral Daily   Or   thiamine  100 mg Intravenous Daily    Objective: Vital signs in last 24 hours: Temp:  [97.5 F (36.4 C)-97.9 F (36.6 C)] 97.5 F (36.4 C) (08/19 0800) Pulse Rate:  [68-85] 75 (08/19 1000) Resp:  [9-25] 14 (08/19 0900) BP: (96-131)/(52-76) 119/71 (08/19 1000) SpO2:  [90 %-95 %] 93 % (08/19 1000)  PHYSICAL EXAM:  General: Somnolent but on waking him up he is responsive little bit more alert Head: Normocephalic, without obvious abnormality, atraumatic. Eyes: Jaundiced ENT Nares normal. No drainage or sinus tenderness. Lips, mucosa, and tongue normal. No Thrush Neck: Supple, symmetrical, no adenopathy, thyroid: non tender no carotid bruit and no JVD. Lungs: Clear to auscultation bilaterally. No Wheezing or Rhonchi. No rales. Heart: Regular rate and rhythm, no murmur, rub or gallop. Abdomen: Soft,  distended. Bowel sounds normal. No masses Extremities: Edema Skin: Icteric Lymph: Cervical, supraclavicular normal. Neurologic: Grossly non-focal  Lab Results Recent Labs    04/08/21 0319 04/09/21 0440  WBC 11.2* 11.7*  HGB 13.6 13.5  HCT 36.4* 36.4*  NA 129* 129*  K 3.5 3.5  CL  94* 96*  CO2 21* 22  BUN 106* 121*  CREATININE UNABLE TO REPORT DUE TO ICTERUS UNABLE TO REPORT DUE TO ICTERUS.PMF   Liver Panel Recent Labs    04/08/21 0319 04/09/21 0440  PROT 6.7 6.5  ALBUMIN 1.8* 1.8*  AST 179* 176*  ALT 97* 102*  ALKPHOS 93 96  BILITOT 32.7* 29.3*   Sedimentation Rate No results for input(s): ESRSEDRATE in the last 72 hours. C-Reactive Protein No results for input(s): CRP in the last 72 hours.  Microbiology:  Studies/Results: No results found.   Assessment/Plan: Acute on chronic liver failure secondary to decompensated liver disease from alcohol use.  He has a history of  hepatitis C.  We will get a PCR to see whether it is still active.  As part of the work-up for viral hepatitis HSV IgM was tested by GI and it is minimally elevated at 1.7. Do not know the significance of this result --ACUTE HSV infection or reactivation causing hepatitis   is more common in people who are  immunocompromised like HIV/ malignancy or transplant or chemo and he does not have any of the above.  HSV PCR.sent   If positive then we could think of treating with acyclovir.  At the current moment the risk of acyclovir administration outweighs the benefit especially with likely kidney injury   Hepatic cirrhosis/ hypersplenism, thrombocytopenia and acute alcoholic hepatitis  Patient is on oral prednisone secondary to acute alcohol hepatitis  Patient had no urinary output for 5 days. Unable to calculate creatinine because of high bilirubin to see whether he has CKD or AKI.  Marland Kitchen Increased ammonia.   Constipation On lactulose  On ceftriaxone for SBP prophylaxis  Severe obesity  Discussed with care team ID will follow him peripherally this weekend- call if needed

## 2021-04-09 NOTE — Progress Notes (Signed)
PROGRESS NOTE  Oscar Poole  DOB: May 13, 1961  PCP: Mendel Ryder, MD OHY:[id number]  DOA: 04/02/2021  LOS: 6 days  Hospital Day: 8   Chief Complaint  Patient presents with   Abdominal Pain    Brief narrative: Oscar Poole is a 60 y.o. male with PMH significant for chronic hepatitis C, history of alcohol and opiate use on Suboxone, hypertension. Patient presented to the ED on 8/12 with complaint of worsening abdominal pain, constipation and decreased urine output. Abdominal pain diffuse for 4 to 5 days associated with nausea vomiting, no bowel movement in 4 to 5 days.  Because of constipation for the duration, he stopped Suboxone.  No urine output in 4 to 5 days. Reports drinking about 5-6 cocktails daily but is unable to do so for the last 5 days because of which he started having tremors.  In the ED, patient was afebrile, blood pressure was initially elevated 278/133 but later on dipped down to 89/62 in few hours Labs with significant abnormality including sodium low at 126, potassium low at 2.8, lipase elevated to 342, AST/ALT/alk phos at 147/64/108, total bilirubin elevated to 22 with direct component significantly elevated to 18, creatinine was unable to calculate because of high level of bilirubin CBC with hemoglobin 15, MCV 102, platelet 97, INR 2.6 Tylenol level less than 10.1.  CT abdomen pelvis showed concern of duodenitis, evidence of hepatic cirrhosis with portal hypertension, small volume ascites.  CBD not well defined but no evidence of biliary dilatation. Admitted to hospitalist service GI was consulted. See below for details  Subjective: Pt remained largely somnolent.  Girlfriend fed him a banana.  Complained of abdominal pain when awake.  Already on dilaudid q6h.   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  Patient's clinical and  laboratory status worsening. -His prognosis is very poor.  This has been shared with the family multiple times.   -GI consult appreciated.   -Patient does not have enough ascites to tap.  Completed SBP ppx with ceftriaxone. Plan: --cont prednisone for alcoholic hepatitis --cont IV albumin 12.5g BID, per GI rec  Hepatic encephalopathy --ammonia 105.  Started on aggressive lactulose.  Having excessive diarrhea 8/17, so lactulose tapered down --cont lactulose 20g BID --ensure 2-3 BM per day  Acute pancreatitis/duodenitis -Presented with progressively worsening abdominal pain in the setting of heavy alcohol use -Lipase level elevated, liver enzymes elevated -CT abdomen findings are more suggestive of duodenitis and pancreatitis. -Patient was started on conservative management with IV fluid, IV albumin, pain medicines, antiemetics and PPI Plan: --oral dilaudid 2 mg q6h PRN --will not increase opioids pain meds since pt is already somnolent most of the time. --cont LR_0   Acute kidney injury -Creatinine is unable to assess because of high bilirubin level.  But clinically patient is making minimal urine output.  High risk of hepatorenal syndrome.   --had been on LR at 100 mill per hour  Plan: --cont LR_1  --Foley for strict I/O  Essential hypertension -Blood pressure in low normal range.  Currently Home meds on hold including amlodipine, HCTZ and lisinopril.   --cont to hold BP meds    Hyponatremia -Likely hypervolemic hyponatremia related to portal hypertension, probably also contributed by low solute intake in last 4 to 5 days. --cont LR_2   Chronic alcoholism -Minimize use of IV Ativan to avoid further encephalopathy  Obesity, BMI 44   DVT prophylaxis: SCD/Compression stockings Code  Status: DNR  Family Communication: girlfriend updated at bedside today Status is: inpatient Dispo:   The patient is from: home Anticipated d/c is to: undetermined Anticipated d/c date is:  undetermined Patient currently is not medically stable to d/c due to: decompensated cirrhosis, high risk of mortality, declined comfort care, currently still want full treatment.   Infusions:   albumin human Stopped (04/09/21 0848)   lactated ringers 50 mL/hr at 04/09/21 1400    Scheduled Meds:  Chlorhexidine Gluconate Cloth  6 each Topical Daily   folic acid  1 mg Oral Daily   lactulose  20 g Oral BID   multivitamin with minerals  1 tablet Oral Daily   mupirocin ointment   Nasal BID   pantoprazole  40 mg Oral BID AC   phytonadione  10 mg Subcutaneous Daily   predniSONE  40 mg Oral Q breakfast   thiamine  100 mg Oral Daily   Or   thiamine  100 mg Intravenous Daily    Antimicrobials: Anti-infectives (From admission, onward)    Start     Dose/Rate Route Frequency Ordered Stop   04/04/21 2300  cefTRIAXone (ROCEPHIN) 1 g in sodium chloride 0.9 % 100 mL IVPB        1 g 200 mL/hr over 30 Minutes Intravenous Every 24 hours 04/03/21 0035 04/08/21 0110   04/02/21 2315  cefTRIAXone (ROCEPHIN) 1 g in sodium chloride 0.9 % 100 mL IVPB        1 g 200 mL/hr over 30 Minutes Intravenous  Once 04/02/21 2313 04/02/21 2348       PRN meds: HYDROmorphone, ondansetron (ZOFRAN) IV   Objective: Vitals:   04/09/21 1500 04/09/21 1600  BP:  114/73  Pulse: 78 78  Resp: 12 17  Temp:  (!) 97.4 F (36.3 C)  SpO2: 95% 95%    Intake/Output Summary (Last 24 hours) at 04/09/2021 1828 Last data filed at 04/09/2021 1600 Gross per 24 hour  Intake 1067.91 ml  Output 955 ml  Net 112.91 ml   Filed Weights   04/02/21 2102  Weight: (!) 148 kg   Weight change:  Body mass index is 44.25 kg/m.   Physical Exam: Constitutional: sleeping HEENT: conjunctivae icterus and lids normal CV: No cyanosis.   RESP: normal respiratory effort, on RA Extremities: edema in BLE SKIN: warm, dry, jaundiced   Data Review: I have personally reviewed the laboratory data and studies available.  Recent Labs   Lab 04/04/21 0411 04/05/21 0544 04/06/21 0521 04/07/21 0305 04/08/21 0319 04/09/21 0440  WBC 4.9 6.8 8.1 9.1 11.2* 11.7*  NEUTROABS 3.7 5.6 6.5 7.4  --   --   HGB 13.7 13.1 14.5 13.3 13.6 13.5  HCT 36.6* 35.6* 39.1 36.3* 36.4* 36.4*  MCV 101.9* 100.8* 97.8 98.6 97.8 98.1  PLT 55* 64* 59* 65* 62* 48*   Recent Labs  Lab 04/02/21 2108 04/03/21 1052 04/04/21 0411 04/05/21 0544 04/06/21 0521 04/07/21 0305 04/08/21 0319 04/09/21 0440  NA 126*   < > 128* 125* 126* 125* 129* 129*  K 2.8*   < > 3.9 3.9 3.6 3.6 3.5 3.5  CL 88*   < > 92* 92* 93* 93* 94* 96*  CO2 21*   < > _0 21* 21* 22  GLUCOSE 132*   < > 107* 121* 129* 131* 122* 128*  BUN 42*   < > 55* 75* 91* 95* 106* 121*  CREATININE UNABLE TO REPORT DUE TO ICTERUS DLB   < > UNABLE TO  REPORT DUE TO ICTERUS UNABLE TO REPORT DUE TO ICTERUS UNABLE TO REPORT DUE TO ICTERUS UNABLE TO REPORT DUE TO ICTERUS UNABLE TO REPORT DUE TO ICTERUS UNABLE TO REPORT DUE TO ICTERUS.PMF  CALCIUM 8.4*   < > 8.0* 7.6* 7.8* 8.0* 7.9* 8.1*  MG 2.2  --  2.2  --   --   --  2.8* 3.0*  PHOS  --   --  UNABLE TO REPORT DUE TO ICTERUS  --   --   --  UNABLE TO REPORT DUE TO ICTERUS  --    < > = values in this interval not displayed.    F/u labs ordered Unresulted Labs (From admission, onward)     Start     Ordered   04/09/21 0500  Herpes simplex virus (HSV), DNA by PCR Blood  Tomorrow morning,   R        04/08/21 2340   04/09/21 0500  HCV RNA quant  Tomorrow morning,   R       Question:  Specimen collection method  Answer:  Lab=Lab collect   04/08/21 2342   04/09/21 0500  HSV(herpes simplex vrs) 1+2 ab-IgG  Tomorrow morning,   R       Question:  Specimen collection method  Answer:  Lab=Lab collect   04/09/21 0013   04/08/21 0500  CBC  Daily,   R     Question:  Specimen collection method  Answer:  Lab=Lab collect   04/07/21 1907   04/08/21 0500  Comprehensive metabolic panel  Daily,   R     Question:  Specimen collection method  Answer:  Lab=Lab  collect   04/07/21 1907   04/08/21 0500  Magnesium  Daily,   R     Question:  Specimen collection method  Answer:  Lab=Lab collect   04/07/21 1907   04/05/21 1443  Varicella-zoster by PCR (Blood or Swab)  Add-on,   AD        04/05/21 1443            Signed, Enzo Bi, MD [facility] 04/09/2021

## 2021-04-09 NOTE — TOC Progression Note (Signed)
Transition of Care [facility]) - Progression Note    Patient Details  Name: Oscar Poole MRN: [id number] Date of Birth: 1961-03-14  Transition of Care [facility]) CM/SW Contact  Joseph Art, [place] Phone Number: 04/09/2021, 2:18 PM  Clinical Narrative:     Plan: No CPR or intubation.  Oscar Poole, Oscar Poole [phone] wishes to respect his father's wishes to continue to treat the treatable . Once patient is unable to make his wishes/desires known, Mr. Arvin Abello would elect comfort care.   Expected Discharge Plan: Home w Home Health Services Barriers to Discharge: Continued Medical Work up  Expected Discharge Plan and Services Expected Discharge Plan: Home w Home Health Services In-house Referral: Clinical Social Work, Hospice / Palliative Care   Post Acute Care Choice: Home Health Living arrangements for the past 2 months: Mobile Home                                       Social Determinants of Health (SDOH) Interventions    Readmission Risk Interventions No flowsheet data found.

## 2021-04-09 NOTE — Progress Notes (Signed)
Palliative: [name] is lying quietly in bed with nursing staff at bedside.  He appears acutely/chronically ill, morbidly obese and frail.  He will briefly make but not keep eye contact.  He is alert and oriented to person and sometimes to situation.  I believe that he can make his basic needs known.  There is no family at bedside at this time.  [name] keeps telling staff that he wants to be "comfortable".  We again today talked about "comfort care".  It is clear that [name] is not interested in comfort care at this time.  He tells me that he wants to "keep moving".  At this point, even though Oscar Poole is not decisional, he continues to state that he is not ready for comfort care.  Surrogate decision Oscar Poole states that they would like to focus on comfort and dignity if/when Oscar Poole is no longer alert and able to communicate his wants/needs  Conference with attending, bedside nursing staff, transition of care team related to patient condition, needs, goals of care.  Plan: At this point continue to treat the treatable but no CPR or intubation.  Healthcare surrogate, son Oscar Poole, wishes to respect his father's wishes to continue to treat the treatable until [name] is obtunded, and unable to make his wishes/desires known.  At that point Oscar Poole he would elect comfort care.  25 minutes  Lillia Carmel, NP Palliative medicine team Team phone [phone] Greater than 50% of this time was spent counseling and coordinating care related to the above assessment and plan.

## 2021-04-10 ENCOUNTER — Inpatient Hospital Stay: Payer: Medicaid Other

## 2021-04-10 DIAGNOSIS — K7200 Acute and subacute hepatic failure without coma: Secondary | ICD-10-CM | POA: Diagnosis not present

## 2021-04-10 LAB — CBC
HCT: 41.8 % (ref 39.0–52.0)
Hemoglobin: 15 g/dL (ref 13.0–17.0)
MCH: 37.3 pg — ABNORMAL HIGH (ref 26.0–34.0)
MCHC: 35.9 g/dL (ref 30.0–36.0)
MCV: 104 fL — ABNORMAL HIGH (ref 80.0–100.0)
Platelets: 45 10*3/uL — ABNORMAL LOW (ref 150–400)
RBC: 4.02 MIL/uL — ABNORMAL LOW (ref 4.22–5.81)
RDW: 17.3 % — ABNORMAL HIGH (ref 11.5–15.5)
WBC: 16 10*3/uL — ABNORMAL HIGH (ref 4.0–10.5)
nRBC: 0 % (ref 0.0–0.2)

## 2021-04-10 LAB — COMPREHENSIVE METABOLIC PANEL
ALT: 108 U/L — ABNORMAL HIGH (ref 0–44)
AST: 170 U/L — ABNORMAL HIGH (ref 15–41)
Albumin: 2.2 g/dL — ABNORMAL LOW (ref 3.5–5.0)
Alkaline Phosphatase: 94 U/L (ref 38–126)
Anion gap: 13 (ref 5–15)
BUN: 128 mg/dL — ABNORMAL HIGH (ref 6–20)
CO2: 19 mmol/L — ABNORMAL LOW (ref 22–32)
Calcium: 8.4 mg/dL — ABNORMAL LOW (ref 8.9–10.3)
Chloride: 97 mmol/L — ABNORMAL LOW (ref 98–111)
Creatinine, Ser: UNDETERMINED mg/dL (ref 0.61–1.24)
Glucose, Bld: 144 mg/dL — ABNORMAL HIGH (ref 70–99)
Potassium: 3.8 mmol/L (ref 3.5–5.1)
Sodium: 129 mmol/L — ABNORMAL LOW (ref 135–145)
Total Bilirubin: 36.2 mg/dL (ref 0.3–1.2)
Total Protein: 6.5 g/dL (ref 6.5–8.1)

## 2021-04-10 LAB — MAGNESIUM: Magnesium: 3.3 mg/dL — ABNORMAL HIGH (ref 1.7–2.4)

## 2021-04-10 LAB — HCV RNA QUANT: HCV Quantitative: NOT DETECTED IU/mL (ref >50)

## 2021-04-10 NOTE — Progress Notes (Signed)
Patient continues to state he does not want breakfast or anything to drink this morning. Breakfast tray removed.

## 2021-04-10 NOTE — Progress Notes (Signed)
PROGRESS NOTE  Oscar Poole  DOB: [birth year]  PCP: Mendel Ryder, MD EGB:[id number]  DOA: 04/02/2021  LOS: 7 days  Hospital Day: 9   Chief Complaint  Patient presents with   Abdominal Pain    Brief narrative: Oscar Poole is a 60 y.o. male with PMH significant for chronic hepatitis C, history of alcohol and opiate use on Suboxone, hypertension. Patient presented to the ED on 8/12 with complaint of worsening abdominal pain, constipation and decreased urine output. Abdominal pain diffuse for 4 to 5 days associated with nausea vomiting, no bowel movement in 4 to 5 days.  Because of constipation for the duration, he stopped Suboxone.  No urine output in 4 to 5 days. Reports drinking about 5-6 cocktails daily but is unable to do so for the last 5 days because of which he started having tremors.  In the ED, patient was afebrile, blood pressure was initially elevated 278/133 but later on dipped down to 89/62 in few hours Labs with significant abnormality including sodium low at 126, potassium low at 2.8, lipase elevated to 342, AST/ALT/alk phos at 147/64/108, total bilirubin elevated to 22 with direct component significantly elevated to 18, creatinine was unable to calculate because of high level of bilirubin CBC with hemoglobin 15, MCV 102, platelet 97, INR 2.6 Tylenol level less than 10.1.  CT abdomen pelvis showed concern of duodenitis, evidence of hepatic cirrhosis with portal hypertension, small volume ascites.  CBD not well defined but no evidence of biliary dilatation. Admitted to hospitalist service GI was consulted. See below for details  Subjective: Pt remained largely sleeping.  When he woke up, pt complained of back pain today.  Son and daughter-in-law helped pt eat and drink some.   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  Patient's  clinical and laboratory status worsening. -His prognosis is very poor.  This has been shared with the family multiple times.   -GI consult appreciated.   -Patient does not have enough ascites to tap.  Completed SBP ppx with ceftriaxone. Plan: --cont prednisone for alcoholic hepatitis --cont IV albumin 12.5g BID, per GI rec  Hepatic encephalopathy --ammonia 105.  Started on aggressive lactulose.  Having excessive diarrhea 8/17, so lactulose tapered down --cont lactulose 20g BID --aim for 2-3 BM per day  Elevated INR --2/2 liver failure --cont Vitamin K 3 of 3 today, per GI  Hypoalbuminemia  --2/2 liver failure and poor oral intake --family to encourage oral intake  Acute pancreatitis/duodenitis -Presented with progressively worsening abdominal pain in the setting of heavy alcohol use -Lipase level elevated, liver enzymes elevated -CT abdomen findings are more suggestive of duodenitis and pancreatitis. -Patient was started on conservative management with IV fluid, IV albumin, pain medicines, antiemetics and PPI Plan: --oral dilaudid 2 mg q6h PRN --will not increase opioids pain meds since pt is already somnolent most of the time.  Acute kidney injury -Creatinine is unable to assess because of high bilirubin level.  But clinically patient is making minimal urine output.  High risk of hepatorenal syndrome.   --had been on LR at 100 mill per hour  Plan: --cont LR_0  --Foley for strict I/O  Essential hypertension, not currently active -Blood pressure in low normal range.  Currently Home meds on hold including amlodipine, HCTZ and lisinopril.   --cont to hold BP meds    Hyponatremia -Likely hypervolemic hyponatremia related to portal hypertension, probably also contributed  by low solute intake in last 4 to 5 days. --cont LR_0   Chronic alcoholism -Minimize use of IV Ativan to avoid further encephalopathy  Obesity, BMI 44   DVT prophylaxis: SCD/Compression stockings Code  Status: DNR  Family Communication: son and daughter-in-law updated at bedside today Status is: inpatient Dispo:   The patient is from: home Anticipated d/c is to: undetermined Anticipated d/c date is: undetermined Patient currently is not medically stable to d/c due to: decompensated cirrhosis, high risk of mortality, declined comfort care, currently still want full treatment.   Infusions:   albumin human Stopped (04/10/21 1110)   lactated ringers 50 mL/hr at 04/10/21 1508    Scheduled Meds:  Chlorhexidine Gluconate Cloth  6 each Topical Daily   folic acid  1 mg Oral Daily   lactulose  20 g Oral BID   multivitamin with minerals  1 tablet Oral Daily   mupirocin ointment   Nasal BID   pantoprazole  40 mg Oral BID AC   predniSONE  40 mg Oral Q breakfast   thiamine  100 mg Oral Daily   Or   thiamine  100 mg Intravenous Daily    Antimicrobials: Anti-infectives (From admission, onward)    Start     Dose/Rate Route Frequency Ordered Stop   04/04/21 2300  cefTRIAXone (ROCEPHIN) 1 g in sodium chloride 0.9 % 100 mL IVPB        1 g 200 mL/hr over 30 Minutes Intravenous Every 24 hours 04/03/21 0035 04/08/21 0110   04/02/21 2315  cefTRIAXone (ROCEPHIN) 1 g in sodium chloride 0.9 % 100 mL IVPB        1 g 200 mL/hr over 30 Minutes Intravenous  Once 04/02/21 2313 04/02/21 2348       PRN meds: HYDROmorphone, ondansetron (ZOFRAN) IV   Objective: Vitals:   04/10/21 0900 04/10/21 1609  BP: 125/78   Pulse:  66  Resp:  (!) 23  Temp:    SpO2:  96%    Intake/Output Summary (Last 24 hours) at 04/10/2021 1658 Last data filed at 04/10/2021 1642 Gross per 24 hour  Intake 1311.24 ml  Output 1375 ml  Net -63.76 ml   Filed Weights   04/02/21 2102  Weight: (!) 148 kg   Weight change:  Body mass index is 44.25 kg/m.   Physical Exam: Constitutional: NAD, sleeping, arousable, but quickly fell back to sleep HEENT: conjunctivae icterus, lids normal CV: No cyanosis.   RESP: normal  respiratory effort, on RA Extremities: edema in BLE SKIN: warm, dry Foley outputting tea-colored urine   Data Review: I have personally reviewed the laboratory data and studies available.  Recent Labs  Lab 04/04/21 0411 04/05/21 0544 04/06/21 0521 04/07/21 0305 04/08/21 0319 04/09/21 0440 04/10/21 0641  WBC 4.9 6.8 8.1 9.1 11.2* 11.7* 16.0*  NEUTROABS 3.7 5.6 6.5 7.4  --   --   --   HGB 13.7 13.1 14.5 13.3 13.6 13.5 15.0  HCT 36.6* 35.6* 39.1 36.3* 36.4* 36.4* 41.8  MCV 101.9* 100.8* 97.8 98.6 97.8 98.1 104.0*  PLT 55* 64* 59* 65* 62* 48* 45*   Recent Labs  Lab 04/04/21 0411 04/05/21 0544 04/06/21 0521 04/07/21 0305 04/08/21 0319 04/09/21 0440 04/10/21 0641  NA 128*   < > 126* 125* 129* 129* 129*  K 3.9   < > 3.6 3.6 3.5 3.5 3.8  CL 92*   < > 93* 93* 94* 96* 97*  CO2 25   < > 22 21* 21* 22 19*  GLUCOSE 107*   < > 129* 131* 122* 128* 144*  BUN 55*   < > 91* 95* 106* 121* 128*  CREATININE UNABLE TO REPORT DUE TO ICTERUS   < > UNABLE TO REPORT DUE TO ICTERUS UNABLE TO REPORT DUE TO ICTERUS UNABLE TO REPORT DUE TO ICTERUS UNABLE TO REPORT DUE TO ICTERUS.PMF UNABLE TO REPORT DUE TO ICTERUS  CALCIUM 8.0*   < > 7.8* 8.0* 7.9* 8.1* 8.4*  MG 2.2  --   --   --  2.8* 3.0* 3.3*  PHOS UNABLE TO REPORT DUE TO ICTERUS  --   --   --  UNABLE TO REPORT DUE TO ICTERUS  --   --    < > = values in this interval not displayed.    F/u labs ordered Unresulted Labs (From admission, onward)     Start     Ordered   04/09/21 0500  Herpes simplex virus (HSV), DNA by PCR Blood  Tomorrow morning,   R        04/08/21 2340   04/09/21 0500  HCV RNA quant  Tomorrow morning,   R       Question:  Specimen collection method  Answer:  Lab=Lab collect   04/08/21 2342   04/09/21 0500  HSV(herpes simplex vrs) 1+2 ab-IgG  Tomorrow morning,   R       Question:  Specimen collection method  Answer:  Lab=Lab collect   04/09/21 0013   04/08/21 0500  CBC  Daily,   R     Question:  Specimen collection  method  Answer:  Lab=Lab collect   04/07/21 1907   04/08/21 0500  Comprehensive metabolic panel  Daily,   R     Question:  Specimen collection method  Answer:  Lab=Lab collect   04/07/21 1907   04/08/21 0500  Magnesium  Daily,   R     Question:  Specimen collection method  Answer:  Lab=Lab collect   04/07/21 1907   04/05/21 1443  Varicella-zoster by PCR (Blood or Swab)  Add-on,   AD        04/05/21 1443            Signed, Enzo Bi, MD [facility] 04/10/2021

## 2021-04-11 DIAGNOSIS — K7200 Acute and subacute hepatic failure without coma: Secondary | ICD-10-CM | POA: Diagnosis not present

## 2021-04-11 LAB — COMPREHENSIVE METABOLIC PANEL
ALT: 113 U/L — ABNORMAL HIGH (ref 0–44)
AST: 178 U/L — ABNORMAL HIGH (ref 15–41)
Albumin: 2.2 g/dL — ABNORMAL LOW (ref 3.5–5.0)
Alkaline Phosphatase: 83 U/L (ref 38–126)
Anion gap: 14 (ref 5–15)
BUN: 131 mg/dL — ABNORMAL HIGH (ref 6–20)
CO2: 21 mmol/L — ABNORMAL LOW (ref 22–32)
Calcium: 8.6 mg/dL — ABNORMAL LOW (ref 8.9–10.3)
Chloride: 98 mmol/L (ref 98–111)
Creatinine, Ser: UNDETERMINED mg/dL (ref 0.61–1.24)
Glucose, Bld: 136 mg/dL — ABNORMAL HIGH (ref 70–99)
Potassium: 3.4 mmol/L — ABNORMAL LOW (ref 3.5–5.1)
Sodium: 133 mmol/L — ABNORMAL LOW (ref 135–145)
Total Bilirubin: 33.2 mg/dL (ref 0.3–1.2)
Total Protein: 6.3 g/dL — ABNORMAL LOW (ref 6.5–8.1)

## 2021-04-11 LAB — CBC
HCT: 41.6 % (ref 39.0–52.0)
Hemoglobin: 15.5 g/dL (ref 13.0–17.0)
MCH: 36 pg — ABNORMAL HIGH (ref 26.0–34.0)
MCHC: 37.3 g/dL — ABNORMAL HIGH (ref 30.0–36.0)
MCV: 98.9 fL (ref 80.0–100.0)
Platelets: 44 10*3/uL — ABNORMAL LOW (ref 150–400)
RBC: 4.3 MIL/uL (ref 4.22–5.81)
RDW: 16.7 % — ABNORMAL HIGH (ref 11.5–15.5)
WBC: 16.8 10*3/uL — ABNORMAL HIGH (ref 4.0–10.5)
nRBC: 0 % (ref 0.0–0.2)

## 2021-04-11 LAB — MAGNESIUM: Magnesium: 3.3 mg/dL — ABNORMAL HIGH (ref 1.7–2.4)

## 2021-04-11 MED ORDER — POTASSIUM CHLORIDE CRYS ER 20 MEQ PO TBCR
40.0000 meq | EXTENDED_RELEASE_TABLET | Freq: Once | ORAL | Status: AC
  Administered 2021-04-11: 40 meq via ORAL
  Filled 2021-04-11: qty 2

## 2021-04-11 NOTE — Progress Notes (Signed)
Notified daughter and girlfriend that patient was moving to room 232 when they were visiting patient.

## 2021-04-11 NOTE — Progress Notes (Signed)
PROGRESS NOTE  Oscar Poole  DOB: March 29, 1961  PCP: Mendel Ryder, MD JSE:[id number]  DOA: 04/02/2021  LOS: 8 days  Hospital Day: 10   Chief Complaint  Patient presents with   Abdominal Pain    Brief narrative: Oscar Poole is a 60 y.o. male with PMH significant for chronic hepatitis C, history of alcohol and opiate use on Suboxone, hypertension. Patient presented to the ED on 8/12 with complaint of worsening abdominal pain, constipation and decreased urine output. Abdominal pain diffuse for 4 to 5 days associated with nausea vomiting, no bowel movement in 4 to 5 days.  Because of constipation for the duration, he stopped Suboxone.  No urine output in 4 to 5 days. Reports drinking about 5-6 cocktails daily but is unable to do so for the last 5 days because of which he started having tremors.  In the ED, patient was afebrile, blood pressure was initially elevated 278/133 but later on dipped down to 89/62 in few hours Labs with significant abnormality including sodium low at 126, potassium low at 2.8, lipase elevated to 342, AST/ALT/alk phos at 147/64/108, total bilirubin elevated to 22 with direct component significantly elevated to 18, creatinine was unable to calculate because of high level of bilirubin CBC with hemoglobin 15, MCV 102, platelet 97, INR 2.6 Tylenol level less than 10.1.  CT abdomen pelvis showed concern of duodenitis, evidence of hepatic cirrhosis with portal hypertension, small volume ascites.  CBD not well defined but no evidence of biliary dilatation. Admitted to hospitalist service GI was consulted. See below for details  Subjective: Pt remained lethargic.  Arousable, but quickly fells back to sleep.  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  Patient's clinical and  laboratory status worsening. -His prognosis is very poor.  This has been shared with the family multiple times.   -GI consult appreciated.   -Patient does not have enough ascites to tap.  Completed SBP ppx with ceftriaxone. Plan: --cont prednisone 40 mg daily for alcoholic hepatitis --cont IV albumin 12.5g BID, per GI rec --cont thiamine and folic acid  Hepatic encephalopathy --ammonia 105.  Started on aggressive lactulose.  Having excessive diarrhea 8/17, so lactulose tapered down --cont lactulose 20g BID --titrate to 2-3 BM's  Elevated INR Thrombocytopenia  --2/2 liver failure --s/p Vitamin K x3 doses, per GI  Hypoalbuminemia  --2/2 liver failure and poor oral intake --family to encourage oral intake  Acute pancreatitis/duodenitis -Presented with progressively worsening abdominal pain in the setting of heavy alcohol use -Lipase level elevated, liver enzymes elevated -CT abdomen findings are more suggestive of duodenitis and pancreatitis. -Patient was started on conservative management with IV fluid, IV albumin, pain medicines, antiemetics and PPI Plan: --oral dilaudid 2 mg q6h PRN --will not increase opioids pain meds since pt is already somnolent most of the time.  Acute kidney injury -Creatinine is unable to assess because of high bilirubin level.  But clinically patient is making minimal urine output.  High risk of hepatorenal syndrome.   --had been on LR at 100 mill per hour  Plan: --cont LR$RemoveBefore'@50'clFVpiMETSvqy$  since not taking in enough oral hydration --Foley for strict I/O  Essential hypertension, not currently active -Blood pressure in low normal range.  Currently Home meds on hold including amlodipine, HCTZ and lisinopril.   --cont to hold BP meds    Hyponatremia -  Likely hypervolemic hyponatremia related to portal hypertension, probably also contributed by low solute intake in last 4 to 5 days. --cont LR$RemoveBefore'@50'sjLtfnHoAZHzb$    Chronic alcoholism -Minimize use of IV Ativan to avoid further  encephalopathy  Obesity, BMI 44  Leukocytosis --likely due to steroid.  No fever. --monitor for signs of infection.    DVT prophylaxis: SCD/Compression stockings Code Status: DNR  Family Communication:  Status is: inpatient Dispo:   The patient is from: home Anticipated d/c is to: undetermined Anticipated d/c date is: undetermined Patient currently is not medically stable to d/c due to: decompensated cirrhosis, high risk of mortality, declined comfort care, currently still want full treatment.   Infusions:   albumin human Stopped (04/11/21 1018)   lactated ringers 50 mL/hr at 04/11/21 1021    Scheduled Meds:  Chlorhexidine Gluconate Cloth  6 each Topical Daily   folic acid  1 mg Oral Daily   lactulose  20 g Oral BID   multivitamin with minerals  1 tablet Oral Daily   mupirocin ointment   Nasal BID   pantoprazole  40 mg Oral BID AC   predniSONE  40 mg Oral Q breakfast   thiamine  100 mg Oral Daily   Or   thiamine  100 mg Intravenous Daily    Antimicrobials: Anti-infectives (From admission, onward)    Start     Dose/Rate Route Frequency Ordered Stop   04/04/21 2300  cefTRIAXone (ROCEPHIN) 1 g in sodium chloride 0.9 % 100 mL IVPB        1 g 200 mL/hr over 30 Minutes Intravenous Every 24 hours 04/03/21 0035 04/08/21 0110   04/02/21 2315  cefTRIAXone (ROCEPHIN) 1 g in sodium chloride 0.9 % 100 mL IVPB        1 g 200 mL/hr over 30 Minutes Intravenous  Once 04/02/21 2313 04/02/21 2348       PRN meds: HYDROmorphone, ondansetron (ZOFRAN) IV   Objective: Vitals:   04/11/21 0900 04/11/21 1400  BP: (!) 104/59 (!) 115/54  Pulse: 76 72  Resp: 17 17  Temp: (!) 96.4 F (35.8 C) (!) 95.7 F (35.4 C)  SpO2: 95% 96%    Intake/Output Summary (Last 24 hours) at 04/11/2021 1551 Last data filed at 04/11/2021 1021 Gross per 24 hour  Intake 1720.87 ml  Output 950 ml  Net 770.87 ml   Filed Weights   04/02/21 2102  Weight: (!) 148 kg   Weight change:  Body mass  index is 44.25 kg/m.   Physical Exam: Constitutional: NAD, sleepy, arousable but not clear enough to answer questions HEENT: conjunctivae icterus, and lids normal CV: No cyanosis.   RESP: normal respiratory effort, on RA GI:  Large abdomen Extremities: edema in BLE SKIN: warm, dry Foley present, tea-colored urine   Data Review: I have personally reviewed the laboratory data and studies available.  Recent Labs  Lab 04/05/21 0544 04/06/21 0521 04/07/21 0305 04/08/21 0319 04/09/21 0440 04/10/21 0641 04/11/21 0632  WBC 6.8 8.1 9.1 11.2* 11.7* 16.0* 16.8*  NEUTROABS 5.6 6.5 7.4  --   --   --   --   HGB 13.1 14.5 13.3 13.6 13.5 15.0 15.5  HCT 35.6* 39.1 36.3* 36.4* 36.4* 41.8 41.6  MCV 100.8* 97.8 98.6 97.8 98.1 104.0* 98.9  PLT 64* 59* 65* 62* 48* 45* 44*   Recent Labs  Lab 04/07/21 0305 04/08/21 0319 04/09/21 0440 04/10/21 0641 04/11/21 0632  NA 125* 129* 129* 129* 133*  K 3.6 3.5 3.5 3.8 3.4*  CL  93* 94* 96* 97* 98  CO2 21* 21* 22 19* 21*  GLUCOSE 131* 122* 128* 144* 136*  BUN 95* 106* 121* 128* 131*  CREATININE UNABLE TO REPORT DUE TO ICTERUS UNABLE TO REPORT DUE TO ICTERUS UNABLE TO REPORT DUE TO ICTERUS.PMF UNABLE TO REPORT DUE TO ICTERUS UNABLE TO REPORT DUE TO ICTERUS  CALCIUM 8.0* 7.9* 8.1* 8.4* 8.6*  MG  --  2.8* 3.0* 3.3* 3.3*  PHOS  --  UNABLE TO REPORT DUE TO ICTERUS  --   --   --     F/u labs ordered Unresulted Labs (From admission, onward)     Start     Ordered   04/09/21 0500  Herpes simplex virus (HSV), DNA by PCR Blood  Tomorrow morning,   R        04/08/21 2340   04/09/21 0500  HSV(herpes simplex vrs) 1+2 ab-IgG  Tomorrow morning,   R       Question:  Specimen collection method  Answer:  Lab=Lab collect   04/09/21 0013   04/08/21 0500  CBC  Daily,   R     Question:  Specimen collection method  Answer:  Lab=Lab collect   04/07/21 1907   04/08/21 0500  Comprehensive metabolic panel  Daily,   R     Question:  Specimen collection method   Answer:  Lab=Lab collect   04/07/21 1907   04/08/21 0500  Magnesium  Daily,   R     Question:  Specimen collection method  Answer:  Lab=Lab collect   04/07/21 1907   04/05/21 1443  Varicella-zoster by PCR (Blood or Swab)  Add-on,   AD        04/05/21 1443            Signed, Enzo Bi, MD [facility] 04/11/2021

## 2021-04-11 NOTE — Plan of Care (Signed)

## 2021-04-12 DIAGNOSIS — K7469 Other cirrhosis of liver: Secondary | ICD-10-CM | POA: Diagnosis not present

## 2021-04-12 DIAGNOSIS — R1084 Generalized abdominal pain: Secondary | ICD-10-CM | POA: Diagnosis not present

## 2021-04-12 DIAGNOSIS — K2980 Duodenitis without bleeding: Secondary | ICD-10-CM | POA: Diagnosis present

## 2021-04-12 DIAGNOSIS — F1190 Opioid use, unspecified, uncomplicated: Secondary | ICD-10-CM | POA: Diagnosis present

## 2021-04-12 DIAGNOSIS — E8809 Other disorders of plasma-protein metabolism, not elsewhere classified: Secondary | ICD-10-CM | POA: Diagnosis present

## 2021-04-12 DIAGNOSIS — Z515 Encounter for palliative care: Secondary | ICD-10-CM | POA: Diagnosis not present

## 2021-04-12 DIAGNOSIS — R042 Hemoptysis: Secondary | ICD-10-CM | POA: Diagnosis not present

## 2021-04-12 DIAGNOSIS — Z66 Do not resuscitate: Secondary | ICD-10-CM | POA: Diagnosis not present

## 2021-04-12 DIAGNOSIS — K7682 Hepatic encephalopathy: Secondary | ICD-10-CM | POA: Diagnosis present

## 2021-04-12 DIAGNOSIS — K7200 Acute and subacute hepatic failure without coma: Secondary | ICD-10-CM | POA: Diagnosis not present

## 2021-04-12 DIAGNOSIS — M549 Dorsalgia, unspecified: Secondary | ICD-10-CM | POA: Diagnosis present

## 2021-04-12 DIAGNOSIS — D696 Thrombocytopenia, unspecified: Secondary | ICD-10-CM | POA: Diagnosis present

## 2021-04-12 DIAGNOSIS — K7031 Alcoholic cirrhosis of liver with ascites: Secondary | ICD-10-CM | POA: Diagnosis present

## 2021-04-12 DIAGNOSIS — R791 Abnormal coagulation profile: Secondary | ICD-10-CM | POA: Diagnosis present

## 2021-04-12 LAB — COMPREHENSIVE METABOLIC PANEL
ALT: 83 U/L — ABNORMAL HIGH (ref 0–44)
ALT: 130 U/L — ABNORMAL HIGH (ref 0–44)
AST: 167 U/L — ABNORMAL HIGH (ref 15–41)
AST: 208 U/L — ABNORMAL HIGH (ref 15–41)
Albumin: 1.8 g/dL — ABNORMAL LOW (ref 3.5–5.0)
Albumin: 2.3 g/dL — ABNORMAL LOW (ref 3.5–5.0)
Alkaline Phosphatase: 81 U/L (ref 38–126)
Alkaline Phosphatase: 87 U/L (ref 38–126)
Anion gap: 11 (ref 5–15)
Anion gap: 13 (ref 5–15)
BUN: 91 mg/dL — ABNORMAL HIGH (ref 6–20)
BUN: 152 mg/dL — ABNORMAL HIGH (ref 6–20)
CO2: 22 mmol/L (ref 22–32)
CO2: 19 mmol/L — ABNORMAL LOW (ref 22–32)
Calcium: 7.8 mg/dL — ABNORMAL LOW (ref 8.9–10.3)
Calcium: 8.3 mg/dL — ABNORMAL LOW (ref 8.9–10.3)
Chloride: 93 mmol/L — ABNORMAL LOW (ref 98–111)
Chloride: 98 mmol/L (ref 98–111)
Creatinine, Ser: UNDETERMINED mg/dL (ref 0.61–1.24)
Creatinine, Ser: UNDETERMINED mg/dL (ref 0.61–1.24)
Glucose, Bld: 129 mg/dL — ABNORMAL HIGH (ref 70–99)
Glucose, Bld: 155 mg/dL — ABNORMAL HIGH (ref 70–99)
Potassium: 3.6 mmol/L (ref 3.5–5.1)
Potassium: 3.8 mmol/L (ref 3.5–5.1)
Sodium: 126 mmol/L — ABNORMAL LOW (ref 135–145)
Sodium: 130 mmol/L — ABNORMAL LOW (ref 135–145)
Total Bilirubin: 36 mg/dL (ref 0.3–1.2)
Total Bilirubin: 39.2 mg/dL (ref 0.3–1.2)
Total Protein: 6.9 g/dL (ref 6.5–8.1)
Total Protein: 5.9 g/dL — ABNORMAL LOW (ref 6.5–8.1)

## 2021-04-12 LAB — PROTIME-INR
INR: 2.9 — ABNORMAL HIGH (ref 0.8–1.2)
Prothrombin Time: 30.3 seconds — ABNORMAL HIGH (ref 11.4–15.2)

## 2021-04-12 LAB — CBC
HCT: 33.4 % — ABNORMAL LOW (ref 39.0–52.0)
Hemoglobin: 12.4 g/dL — ABNORMAL LOW (ref 13.0–17.0)
MCH: 36.4 pg — ABNORMAL HIGH (ref 26.0–34.0)
MCHC: 37.1 g/dL — ABNORMAL HIGH (ref 30.0–36.0)
MCV: 97.9 fL (ref 80.0–100.0)
Platelets: 32 10*3/uL — ABNORMAL LOW (ref 150–400)
RBC: 3.41 MIL/uL — ABNORMAL LOW (ref 4.22–5.81)
RDW: 17 % — ABNORMAL HIGH (ref 11.5–15.5)
WBC: 24.8 10*3/uL — ABNORMAL HIGH (ref 4.0–10.5)
nRBC: 0 % (ref 0.0–0.2)

## 2021-04-12 LAB — GLUCOSE, CAPILLARY: Glucose-Capillary: 141 mg/dL — ABNORMAL HIGH (ref 70–99)

## 2021-04-12 LAB — MAGNESIUM: Magnesium: 3.4 mg/dL — ABNORMAL HIGH (ref 1.7–2.4)

## 2021-04-12 MED ORDER — MIDAZOLAM HCL (PF) 2 MG/2ML IJ SOLN
2.0000 mg | INTRAMUSCULAR | Status: DC | PRN
  Administered 2021-04-12: 2 mg via INTRAVENOUS
  Filled 2021-04-12 (×2): qty 2

## 2021-04-12 MED ORDER — HYDROMORPHONE HCL 1 MG/ML IJ SOLN
2.0000 mg | INTRAMUSCULAR | Status: DC | PRN
  Administered 2021-04-12 – 2021-04-14 (×7): 2 mg via INTRAVENOUS
  Filled 2021-04-12 (×8): qty 2

## 2021-04-12 MED ORDER — HYDROMORPHONE HCL 2 MG PO TABS
2.0000 mg | ORAL_TABLET | Freq: Once | ORAL | Status: AC
  Administered 2021-04-12: 2 mg via ORAL
  Filled 2021-04-12: qty 1

## 2021-04-12 MED ORDER — LORAZEPAM 2 MG/ML IJ SOLN: 2.0000 mg | INTRAMUSCULAR | Status: DC | PRN

## 2021-04-12 NOTE — Progress Notes (Signed)
Pt being transferred to [id number].  Called and gave report to [facility]

## 2021-04-12 NOTE — Progress Notes (Signed)
Patient ID: WADDELL ITEN, male   DOB: December 02, 1960, 60 y.o.   MRN: [id number]    Progress Note from the Palliative Medicine Team at [facility]   Patient Name: Oscar Poole        Date: 04/12/2021 DOB: 29-Jul-1961  Age: 60 y.o. MRN#: [id number] Attending Physician: Darlin Priestly, MD Primary Care Physician: [name], MD Admit Date: 04/02/2021   Medical records reviewed   60 y.o. 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   Today is day 9 of this hospitalization and unfortunately patient has continued to decompensate.    Clarification was made today with patient's son/Oscar Poole and patient himself, desire to shift to a full comfort path allowing for natural death by bedside RN/ Jackquline Bosch RN and witness.    Plan of Care: -DNR/DNI -Comfort and dignity are focus of care -No further diagnostics or life prolonging measures, allow a natural death -Symptom management         -Dilaudid 2 mg IV every 1 hour as needed for pain or dyspnea         -Ativan 2 mg IV every 4 hours as needed for agitation/anxiety -Prognosis is likely hours to days-         evaluate in the morning for possible transition of care to residential hospice.  Probable hospital death -Spiritual care consulted at family request  Education offered on natural trajectory and expectations at end-of-life.  Emotional support offered  Questions and concerns addressed   Discussed with Dr Fran Lowes   PMT will continue to support holistically  Total time spent on the unit was 35  minutes  Greater than 50% of the time was spent in counseling and coordination of care  Lorinda Creed NP  Palliative Medicine Team Team Phone # [phone] Pager [phone]

## 2021-04-12 NOTE — Plan of Care (Addendum)
Patient is in extreme discomfort 10 of 10 ongoing pain and ask anyone who enters the room to get him some pain meds.  Jamse Mead, RN was asked to witness at bedside as we dissussed with patient the decision of moving to comfort care.  Patient voiced understanding that it would allow him to get more pain medicine which would make him more comfortable and allow his body to go into a deep sleep and eventually die.  Patient asked me to contact son and fiancee to inform and have them come to bedside.  Son contacted and left message to please call the hospital (return phone # given).  After informing patient that I was unable to reach his son by phone but left a message.  Patient asked " did you put the orders in?"  Dr. Informed.   1450 I, RN, just got off phone with Stevie/Son/POA.  Steffanie Rainwater also in room - Son was able to discuss comfort measures with patient over phone (on speaker) and gave ok to change to comfort measures per patient/dad's wishes.  Patient stated, "Im gonna die and dont want to hurt anymore."   Dr. Informed.

## 2021-04-12 NOTE — Progress Notes (Signed)
   04/12/21 1819  Clinical Encounter Type  Visited With Family  Visit Type Follow-up  Referral From Nurse  Consult/Referral To Chaplain   This chaplain received a referral to support the patient's fiance. Upon my arrival, the patient was observed to sleeping/resting soundly. His fiance, Burna Mortimer sat at the bedside. Burna Mortimer shared that she is exhausted both physically and emotionally. She also shared that she wanted to retrieve "some paperwork" that the patient's son, Eulah Citizen advised about. We thought this paperwork may be the HPOA documentation, but we confirmed that paperwork is already on file in the patient's EMR. The patient's fiance plans to be in touch with the patient's son for more information. She will update the patient's nurse with additional needs that a chaplain can assist with. No further needs at this time.  Clovis Riley, Chaplain

## 2021-04-12 NOTE — Progress Notes (Signed)
Pt has productive cough with moderate amounts of bloody sputum and moderate sized clots.Saved specimen at bedisde in case MD would like to assess color/ amount.  MD Lindajo Royal notified . See orders. Will continue to monitor. Call bell within reach pt has no other concerns at this time.

## 2021-04-12 NOTE — Progress Notes (Signed)
PROGRESS NOTE  [name]  DOB: [birth year]  PCP: Mendel Ryder, MD BMW:[id number]  DOA: 04/02/2021  LOS: 9 days  Hospital Day: 43   Chief Complaint  Patient presents with   Abdominal Pain    Brief narrative: Oscar Poole is a 60 y.o. male with PMH significant for chronic hepatitis C, history of alcohol and opiate use on Suboxone, hypertension. Patient presented to the ED on 8/12 with complaint of worsening abdominal pain, constipation and decreased urine output. Abdominal pain diffuse for 4 to 5 days associated with nausea vomiting, no bowel movement in 4 to 5 days.  Because of constipation for the duration, he stopped Suboxone.  No urine output in 4 to 5 days. Reports drinking about 5-6 cocktails daily but is unable to do so for the last 5 days because of which he started having tremors.  In the ED, patient was afebrile, blood pressure was initially elevated 278/133 but later on dipped down to 89/62 in few hours Labs with significant abnormality including sodium low at 126, potassium low at 2.8, lipase elevated to 342, AST/ALT/alk phos at 147/64/108, total bilirubin elevated to 22 with direct component significantly elevated to 18, creatinine was unable to calculate because of high level of bilirubin CBC with hemoglobin 15, MCV 102, platelet 97, INR 2.6 Tylenol level less than 10.1.  CT abdomen pelvis showed concern of duodenitis, evidence of hepatic cirrhosis with portal hypertension, small volume ascites.  CBD not well defined but no evidence of biliary dilatation. Admitted to hospitalist service GI was consulted. See below for details  Subjective: Nursing noted sputum was bloody.  Pt complained more about pain today.  Pt decided on comfort care, son and significant other concurred.     Assessment/Plan:  Comfort care status --decision made today for comfort care measures.   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  Patient's clinical and laboratory status worsening. -His prognosis is very poor.  This has been shared with the family multiple times.   -GI consult appreciated.   -Patient does not have enough ascites to tap.  Completed SBP ppx with ceftriaxone. Plan: --d/c prednisone 40 mg daily --d/c IV albumin 12.5g BID --d/c thiamine and folic acid  Hepatic encephalopathy --ammonia 105.  Started on aggressive lactulose.  Having excessive diarrhea 8/17, so lactulose tapered down --d/c lactulose  Elevated INR Thrombocytopenia  --2/2 liver failure --s/p Vitamin K x3 doses, per GI  Hypoalbuminemia  --2/2 liver failure and poor oral intake  Acute pancreatitis/duodenitis -Presented with progressively worsening abdominal pain in the setting of heavy alcohol use -Lipase level elevated, liver enzymes elevated -CT abdomen findings are more suggestive of duodenitis and pancreatitis. -Patient was started on conservative management with IV fluid, IV albumin, pain medicines, antiemetics and PPI Plan: --IV dilaudid PRN  Acute kidney injury -Creatinine is unable to assess because of high bilirubin level.  But clinically patient is making minimal urine output.  High risk of hepatorenal syndrome.   --had been on LR at 100 mill per hour  Plan: --reduce LR to 10 --cont foley  Essential hypertension, not currently active -Blood pressure in low normal range.  Currently Home meds on hold including amlodipine, HCTZ and lisinopril.   --cont to hold BP meds    Hyponatremia -Likely hypervolemic hyponatremia related to portal hypertension, probably also contributed by low solute  intake in last 4 to 5 days. --reduce LR to 10  Chronic alcoholism  Obesity, BMI 44  Leukocytosis --likely due to steroid.  No fever.   DVT prophylaxis: SCD/Compression  stockings Code Status: DNR  Family Communication: significant other updated at bedside today Status is: inpatient Dispo:   The patient is from: home Anticipated d/c is to: hospice facility Anticipated d/c date is: whenever bed Patient currently is ready for discharge.   Infusions:   lactated ringers 10 mL/hr at 04/12/21 1626    Scheduled Meds:  Chlorhexidine Gluconate Cloth  6 each Topical Daily   mupirocin ointment   Nasal BID    Antimicrobials: Anti-infectives (From admission, onward)    Start     Dose/Rate Route Frequency Ordered Stop   04/04/21 2300  cefTRIAXone (ROCEPHIN) 1 g in sodium chloride 0.9 % 100 mL IVPB        1 g 200 mL/hr over 30 Minutes Intravenous Every 24 hours 04/03/21 0035 04/08/21 0110   04/02/21 2315  cefTRIAXone (ROCEPHIN) 1 g in sodium chloride 0.9 % 100 mL IVPB        1 g 200 mL/hr over 30 Minutes Intravenous  Once 04/02/21 2313 04/02/21 2348       PRN meds: HYDROmorphone (DILAUDID) injection, midazolam PF, ondansetron (ZOFRAN) IV   Objective: Vitals:   04/12/21 0451 04/12/21 0900  BP: 118/67 122/70  Pulse: 75 82  Resp: 16 18  Temp: 98.4 F (36.9 C) 99.2 F (37.3 C)  SpO2: 96% 94%    Intake/Output Summary (Last 24 hours) at 04/12/2021 1817 Last data filed at 04/12/2021 1811 Gross per 24 hour  Intake 1197.57 ml  Output 200 ml  Net 997.57 ml   Filed Weights   04/02/21 2102  Weight: (!) 148 kg   Weight change:  Body mass index is 44.25 kg/m.   Physical Exam: Constitutional: lethargic CV: No cyanosis.   RESP: normal respiratory effort, on RA Extremities: edema in BLE SKIN: jaundiced Foley present   Data Review: I have personally reviewed the laboratory data and studies available.  Recent Labs  Lab 04/06/21 0521 04/07/21 0305 04/08/21 0319 04/09/21 0440 04/10/21 0641 04/11/21 0632 04/12/21 0647  WBC 8.1 9.1 11.2* 11.7* 16.0* 16.8* 24.8*  NEUTROABS 6.5 7.4  --   --   --   --   --   HGB 14.5 13.3 13.6 13.5 15.0  15.5 12.4*  HCT 39.1 36.3* 36.4* 36.4* 41.8 41.6 33.4*  MCV 97.8 98.6 97.8 98.1 104.0* 98.9 97.9  PLT 59* 65* 62* 48* 45* 44* 32*   Recent Labs  Lab 04/08/21 0319 04/09/21 0440 04/10/21 0641 04/11/21 0632 04/12/21 0647  NA 129* 129* 129* 133* 130*  K 3.5 3.5 3.8 3.4* 3.8  CL 94* 96* 97* 98 98  CO2 21* 22 19* 21* 19*  GLUCOSE 122* 128* 144* 136* 155*  BUN 106* 121* 128* 131* 152*  CREATININE UNABLE TO REPORT DUE TO ICTERUS UNABLE TO REPORT DUE TO ICTERUS.PMF UNABLE TO REPORT DUE TO ICTERUS UNABLE TO REPORT DUE TO ICTERUS UNABLE TO REPORT DUE TO ICTERUS  CALCIUM 7.9* 8.1* 8.4* 8.6* 8.3*  MG 2.8* 3.0* 3.3* 3.3* 3.4*  PHOS UNABLE TO REPORT DUE TO ICTERUS  --   --   --   --     F/u labs ordered Unresulted Labs (From admission, onward)    None       Signed, Enzo Bi, MD [facility] 04/12/2021

## 2021-04-13 DIAGNOSIS — R52 Pain, unspecified: Secondary | ICD-10-CM | POA: Diagnosis not present

## 2021-04-13 DIAGNOSIS — K7469 Other cirrhosis of liver: Secondary | ICD-10-CM | POA: Diagnosis not present

## 2021-04-13 DIAGNOSIS — Z515 Encounter for palliative care: Secondary | ICD-10-CM | POA: Diagnosis not present

## 2021-04-13 LAB — HSV(HERPES SIMPLEX VRS) I + II AB-IGG
HSV 1 Glycoprotein G Ab, IgG: 34.8 index — ABNORMAL HIGH (ref 0.00–0.90)
HSV 2 Glycoprotein G Ab, IgG: 1.39 index — ABNORMAL HIGH (ref 0.00–0.90)

## 2021-04-13 LAB — HSV-2 IGG SUPPLEMENTAL TEST: HSV-2 IgG Supplemental Test: POSITIVE — AB

## 2021-04-13 NOTE — TOC Progression Note (Signed)
Transition of Care [facility]) - Progression Note    Patient Details  Name: Oscar Poole MRN: [id number] Date of Birth: 1961/04/11  Transition of Care [facility]) CM/SW Contact  Barrie Dunker, RN Phone Number: 04/13/2021, 9:54 AM  Clinical Narrative:   Spoke with the Hospice Liason with [facility] and requested to review for Hospice facility, awaiting her feedback    Expected Discharge Plan: Home w Home Health Services Barriers to Discharge: Continued Medical Work up  Expected Discharge Plan and Services Expected Discharge Plan: Home w Home Health Services In-house Referral: Clinical Social Work, Hospice / Palliative Care   Post Acute Care Choice: Home Health Living arrangements for the past 2 months: Mobile Home                                       Social Determinants of Health (SDOH) Interventions    Readmission Risk Interventions No flowsheet data found.

## 2021-04-13 NOTE — TOC Progression Note (Deleted)
Transition of Care [facility]) - Progression Note    Patient Details  Name: Oscar Poole MRN: [id number] Date of Birth: [birth year]  Transition of Care [facility]) CM/SW Contact  Barrie Dunker, RN Phone Number: 04/13/2021, 11:26 AM  Clinical Narrative:     Sent referral for Hospice facility to [facility] the Hospice liason for [facility], awaiting a responce  Expected Discharge Plan: Home w Home Health Services Barriers to Discharge: Continued Medical Work up  Expected Discharge Plan and Services Expected Discharge Plan: Home w Home Health Services In-house Referral: Clinical Social Work, Hospice / Palliative Care   Post Acute Care Choice: Home Health Living arrangements for the past 2 months: Mobile Home                                       Social Determinants of Health (SDOH) Interventions    Readmission Risk Interventions No flowsheet data found.

## 2021-04-13 NOTE — Progress Notes (Signed)
Patient ID: [name], male   DOB: 11/02/1960, 60 y.o.   MRN: [id number]    Progress Note from the Palliative Medicine Team at [facility]   Patient Name: Oscar Poole        Date: 04/13/2021 DOB: 10-07-1960  Age: 60 y.o. MRN#: [id number] Attending Physician: Darlin Priestly, MD Primary Care Physician: [name], MD Admit Date: 04/02/2021   Medical records reviewed   60 y.o. 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   Clarification was made yesterday with patient's son/Steve E. Manson Passey and patient himself, desire to shift to a full comfort path allowing for natural death by bedside RN/ Jackquline Bosch RN and witness.   Focus of care is comfort, quality and dignity  Today patient is minimally responsive to gentle touch and verbal stimuli.  He appears comfortable.  He appears to be transitioning at end-of-life.  Prognosis is likely hours to days.   Plan of Care: -DNR/DNI -Comfort and dignity are focus of care -No further diagnostics or life prolonging measures, allow a natural death -Symptom management         -Dilaudid 2 mg IV every 1 hour as needed for pain or dyspnea         -Ativan 2 mg IV every 4 hours as needed for agitation/anxiety -Residential hospice for end-of-life care, will place referral       -Spiritual care consulted at family request  Education offered on natural trajectory and expectations at end-of-life.    Emotional support offered  Questions and concerns addressed   Discussed with Dr Fran Lowes   PMT will continue to support holistically  Total time spent on the unit was 15  minutes  Greater than 50% of the time was spent in counseling and coordination of care  Lorinda Creed NP  Palliative Medicine Team Team Phone # [phone] Pager [phone]

## 2021-04-13 NOTE — Progress Notes (Signed)
Nutrition Brief Note  Chart reviewed.  Pt now transitioning to comfort care.   No further nutrition interventions planned at this time.   Please consult as needed.   Vertell Limber, RD, LDN (she/her/hers) Registered Dietitian I After-Hours/Weekend Pager # in [place]

## 2021-04-13 NOTE — Progress Notes (Addendum)
[facility] [employer], [employer] [facility]) Hospital Liaison RN Note  Received request from Marice Potter, RN [facility] Manager for family interest in [facility]. Visited patient at bedside and spoke with patient's significant other, Oscar Poole and son, Eulah Citizen over the phone to confirm interest and explain services.   Chart and patient information reviewed by [facility] physician. Hospice Home eligibility confirmed.  Unfortunately Hospice Home is not able to offer a room today. Family and Marice Potter, RN [facility] Manager aware hospital liaison will follow up tomorrow or sooner if a room becomes available.  Please do not hesitate to call with any hospice related questions or concerns.  Thank you for the opportunity to participate in this patient's care.   Bobbie "Einar Gip, RN, BSN [facility] Liaison [phone]

## 2021-04-13 NOTE — Progress Notes (Signed)
PROGRESS NOTE  Oscar Poole  DOB: 06/30/1961  PCP: Mendel Ryder, MD QIW:[id number]  DOA: 04/02/2021  LOS: 10 days  Hospital Day: 12   Chief Complaint  Patient presents with   Abdominal Pain    Brief narrative: Oscar Poole is a 60 y.o. male with PMH significant for chronic hepatitis C, history of alcohol and opiate use on Suboxone, hypertension. Patient presented to the ED on 8/12 with complaint of worsening abdominal pain, constipation and decreased urine output. Abdominal pain diffuse for 4 to 5 days associated with nausea vomiting, no bowel movement in 4 to 5 days.  Because of constipation for the duration, he stopped Suboxone.  No urine output in 4 to 5 days. Reports drinking about 5-6 cocktails daily but is unable to do so for the last 5 days because of which he started having tremors.  In the ED, patient was afebrile, blood pressure was initially elevated 278/133 but later on dipped down to 89/62 in few hours Labs with significant abnormality including sodium low at 126, potassium low at 2.8, lipase elevated to 342, AST/ALT/alk phos at 147/64/108, total bilirubin elevated to 22 with direct component significantly elevated to 18, creatinine was unable to calculate because of high level of bilirubin CBC with hemoglobin 15, MCV 102, platelet 97, INR 2.6 Tylenol level less than 10.1.  CT abdomen pelvis showed concern of duodenitis, evidence of hepatic cirrhosis with portal hypertension, small volume ascites.  CBD not well defined but no evidence of biliary dilatation. Admitted to hospitalist service GI was consulted. See below for details  Subjective: Pt stayed mostly asleep, but per RN, when woke up, complained of severe pain, for which pt was given IV dilaudid.    Referral send to hospice facility.   Assessment/Plan:  Comfort care status --decision made on 8/22 for comfort care measures.   --treat pain and anxiety --accepted by hospice facility, discharge when bed  ready.  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  Patient's clinical and laboratory status worsening. -His prognosis is very poor.  This has been shared with the family multiple times.   -GI consult appreciated.   -Patient does not have enough ascites to tap.  Completed SBP ppx with ceftriaxone. --prednisone, IV albumin, thiamine and folic acid all d/c'ed due to comfort care status.  Hepatic encephalopathy --ammonia 105.  Started on aggressive lactulose.  Having excessive diarrhea 8/17, so lactulose tapered down --lactulose d/c'ed due to comfort care status  Elevated INR Thrombocytopenia  --2/2 liver failure --s/p Vitamin K x3 doses, per GI  Hypoalbuminemia  --2/2 liver failure and poor oral intake  Acute pancreatitis/duodenitis -Presented with progressively worsening abdominal pain in the setting of heavy alcohol use -Lipase level elevated, liver enzymes elevated -CT abdomen findings are more suggestive of duodenitis and pancreatitis. -Patient was started on conservative management with IV fluid, IV albumin, pain medicines, antiemetics and PPI Plan: --IV dilaudid PRN  Acute kidney injury -Creatinine is unable to assess because of high bilirubin level.  But clinically patient is making minimal urine output.  High risk of hepatorenal syndrome.   --had been on LR at 100 mill per hour  Plan: --cont LR @ 10 --cont foley  Essential hypertension, not currently active -Blood pressure in low normal range.  Currently Home meds on hold including amlodipine, HCTZ and lisinopril.   --cont to hold BP meds    Hyponatremia -Likely hypervolemic hyponatremia  related to portal hypertension, probably also contributed by low solute intake in last 4 to 5 days. --cont LR @ 10  Chronic alcoholism  Obesity, BMI 44  Leukocytosis --likely due to steroid.   No fever.   DVT prophylaxis: SCD/Compression stockings Code Status: DNR  Family Communication:  Status is: inpatient Dispo:   The patient is from: home Anticipated d/c is to: hospice facility Anticipated d/c date is: whenever bed Patient currently is ready for discharge.   Infusions:   lactated ringers 10 mL/hr at 04/12/21 1626    Scheduled Meds:  Chlorhexidine Gluconate Cloth  6 each Topical Daily   mupirocin ointment   Nasal BID    Antimicrobials: Anti-infectives (From admission, onward)    Start     Dose/Rate Route Frequency Ordered Stop   04/04/21 2300  cefTRIAXone (ROCEPHIN) 1 g in sodium chloride 0.9 % 100 mL IVPB        1 g 200 mL/hr over 30 Minutes Intravenous Every 24 hours 04/03/21 0035 04/08/21 0110   04/02/21 2315  cefTRIAXone (ROCEPHIN) 1 g in sodium chloride 0.9 % 100 mL IVPB        1 g 200 mL/hr over 30 Minutes Intravenous  Once 04/02/21 2313 04/02/21 2348       PRN meds: HYDROmorphone (DILAUDID) injection, midazolam PF, ondansetron (ZOFRAN) IV   Objective: Vitals:   04/12/21 2335 04/13/21 0822  BP: 119/67 (!) 101/56  Pulse: 68 69  Resp: 15 17  Temp: 98.5 F (36.9 C) (!) 97.4 F (36.3 C)  SpO2: 94% 96%    Intake/Output Summary (Last 24 hours) at 04/13/2021 1722 Last data filed at 04/13/2021 0508 Gross per 24 hour  Intake --  Output 150 ml  Net -150 ml   Filed Weights   04/02/21 2102  Weight: (!) 148 kg   Weight change:  Body mass index is 44.25 kg/m.   Physical Exam: Constitutional: sleeping CV: No cyanosis.   RESP: normal respiratory effort, on RA Extremities:  edema in BLE SKIN: warm, dry, jaundiced   Data Review: I have personally reviewed the laboratory data and studies available.  Recent Labs  Lab 04/07/21 0305 04/08/21 0319 04/09/21 0440 04/10/21 0641 04/11/21 0632 04/12/21 0647  WBC 9.1 11.2* 11.7* 16.0* 16.8* 24.8*  NEUTROABS 7.4  --   --   --   --   --   HGB 13.3 13.6 13.5 15.0 15.5 12.4*  HCT 36.3* 36.4*  36.4* 41.8 41.6 33.4*  MCV 98.6 97.8 98.1 104.0* 98.9 97.9  PLT 65* 62* 48* 45* 44* 32*   Recent Labs  Lab 04/08/21 0319 04/09/21 0440 04/10/21 0641 04/11/21 0632 04/12/21 0647  NA 129* 129* 129* 133* 130*  K 3.5 3.5 3.8 3.4* 3.8  CL 94* 96* 97* 98 98  CO2 21* 22 19* 21* 19*  GLUCOSE 122* 128* 144* 136* 155*  BUN 106* 121* 128* 131* 152*  CREATININE UNABLE TO REPORT DUE TO ICTERUS UNABLE TO REPORT DUE TO ICTERUS.PMF UNABLE TO REPORT DUE TO ICTERUS UNABLE TO REPORT DUE TO ICTERUS UNABLE TO REPORT DUE TO ICTERUS  CALCIUM 7.9* 8.1* 8.4* 8.6* 8.3*  MG 2.8* 3.0* 3.3* 3.3* 3.4*  PHOS UNABLE TO REPORT DUE TO ICTERUS  --   --   --   --     F/u labs ordered Unresulted Labs (From admission, onward)    None       Signed, Enzo Bi, MD [facility] 04/13/2021

## 2021-04-14 DIAGNOSIS — F1010 Alcohol abuse, uncomplicated: Secondary | ICD-10-CM | POA: Diagnosis not present

## 2021-04-14 DIAGNOSIS — K7200 Acute and subacute hepatic failure without coma: Secondary | ICD-10-CM | POA: Diagnosis not present

## 2021-04-14 DIAGNOSIS — K7469 Other cirrhosis of liver: Secondary | ICD-10-CM | POA: Diagnosis not present

## 2021-04-14 MED ORDER — MORPHINE SULFATE (CONCENTRATE) 10 MG/0.5ML PO SOLN: 5.0000 mg | ORAL | 0 refills | Status: AC | PRN

## 2021-04-14 MED ORDER — HYDROMORPHONE HCL 1 MG/ML IJ SOLN: 2.0000 mg | INTRAMUSCULAR | 0 refills | Status: AC | PRN

## 2021-04-14 MED ORDER — MORPHINE SULFATE (CONCENTRATE) 10 MG/0.5ML PO SOLN: 10.0000 mg | ORAL | Status: DC | PRN

## 2021-04-14 MED ORDER — LORAZEPAM 1 MG PO TABS
1.0000 mg | ORAL_TABLET | ORAL | Status: DC | PRN
  Administered 2021-04-14 (×2): 1 mg via ORAL
  Filled 2021-04-14 (×2): qty 1

## 2021-04-14 MED ORDER — MORPHINE SULFATE (CONCENTRATE) 10 MG/0.5ML PO SOLN
5.0000 mg | ORAL | Status: DC | PRN
  Administered 2021-04-14 (×2): 5 mg via ORAL
  Filled 2021-04-14 (×2): qty 0.5

## 2021-04-14 NOTE — Discharge Summary (Signed)
Physician Discharge Summary  Oscar Poole KDX:[id number] DOB: Jan 13, 1961 DOA: 04/02/2021  PCP: Mendel Ryder, MD  Admit date: 04/02/2021 Discharge date: 04/14/2021  Admitted From: home  Disposition:  hospice facility   Recommendations for Outpatient Follow-up:  Follow up with hospice provider ASAP  Home Health: no  Equipment/Devices:  Discharge Condition:hospice CODE STATUS: DNR/DNI Diet recommendation: as tolerated  Brief/Interim Summary: HPI was taken from Oscar Poole: Oscar Poole is a 60 y.o. male with medical history significant for chronic hepatitis C, hypertension, history of alcohol and opioid abuse on Suboxone who presents with concerns of worsening abdominal pain, constipation and decreased urine output.   Patient reports that for the past 4 to 5 days he has been having diffuse abdominal pain with nausea and vomiting.  Has been unable to have a bowel movement for 4 to 5 days.  Stop his Suboxone 5 days ago due to constipation.  Has had minimal urine output around 5 days.  Reports drinking about 5-6 cocktails daily but quit 4 to 5 days ago.  Has felt more tremulous.  Denies any confusion.  Denies other illicit drug use.  Has had lower extremity edema for months. Endorse compliant with his antihypertensives.   ED Course: He was afebrile, hypotensive down to systolic of 97Q over 73A with mild tachypnea on room air. Lab work significant for total bilirubin of 22, anion gap of 17, AST of 147, ALT of 64, normal alkaline phosphatase, INR of 2.6. Ammonia of 50. Lipase of 342. Sodium of 126, K of 2.8, BG of 132 Creatinine was incalculable due to significantly elevated bilirubin   CT of the abdomen and pelvis was obtained showing fat stranding and inflammatory changes around the duodenum and pancreas although peptic ulcer disease or duodenitis is more likely rather than pancreatitis.  There is hepatic cirrhosis with portal hypertension and small volume abdominal pelvic ascites.   ED  physician discussed with GI Oscar Poole recommends starting patient on steroids and will see in consultation in the morning.  Patient currently not a liver transplant candidate due to ongoing alcohol use.  Hospitalist then called for admission.  Hospital course was taken from Oscar Poole: Oscar Poole is a 60 y.o. male with PMH significant for chronic hepatitis C, history of alcohol and opiate use on Suboxone, hypertension. Patient presented to the ED on 8/12 with complaint of worsening abdominal pain, constipation and decreased urine output. Abdominal pain diffuse for 4 to 5 days associated with nausea vomiting, no bowel movement in 4 to 5 days.  Because of constipation for the duration, he stopped Suboxone.  No urine output in 4 to 5 days. Reports drinking about 5-6 cocktails daily but is unable to do so for the last 5 days because of which he started having tremors.   In the ED, patient was afebrile, blood pressure was initially elevated 278/133 but later on dipped down to 89/62 in few hours Labs with significant abnormality including sodium low at 126, potassium low at 2.8, lipase elevated to 342, AST/ALT/alk phos at 147/64/108, total bilirubin elevated to 22 with direct component significantly elevated to 18, creatinine was unable to calculate because of high level of bilirubin CBC with hemoglobin 15, MCV 102, platelet 97, INR 2.6 Tylenol level less than 10.1.  CT abdomen pelvis showed concern of duodenitis, evidence of hepatic cirrhosis with portal hypertension, small volume ascites.  CBD not well defined but no evidence of biliary dilatation. Admitted to hospitalist service GI was consulted. See below for  details   As per Oscar Poole 04/14/21: A hospice bed was available today so pt was d/c to hospice facility. Further management as per hospice provider  Discharge Diagnoses:  Principal Problem:   Decompensated liver disease ([place]) Active Problems:   Alcohol intoxication ([place])   Hypokalemia    Acute renal failure ([facility])   Acute pancreatitis   Hypotension   Hyponatremia   Alcohol abuse   Alcoholic hepatitis with ascites   Bilirubinemia   Alcoholic cirrhosis of liver with ascites ([facility])   Acute hepatic encephalopathy   Elevated INR   Thrombocytopenia ([facility])   Hypoalbuminemia   Duodenitis   Obesity, Class III, BMI 40-49.9 (morbid obesity) ([facility])   Chronic, continuous use of opioids   Hemoptysis   Back pain  A&P as per Oscar Poole:   Comfort care status --decision made on 8/22 for comfort care measures.   --treat pain and anxiety --accepted by hospice facility, discharge when bed ready.   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  Patient's clinical and laboratory status worsening. -His prognosis is very poor.  This has been shared with the family multiple times.   -GI consult appreciated.   -Patient does not have enough ascites to tap.  Completed SBP ppx with ceftriaxone. --prednisone, IV albumin, thiamine and folic acid all d/c'ed due to comfort care status.   Hepatic encephalopathy --ammonia 105.  Started on aggressive lactulose.  Having excessive diarrhea 8/17, so lactulose tapered down --lactulose d/c'ed due to comfort care status   Elevated INR Thrombocytopenia  --2/2 liver failure --s/p Vitamin K x3 doses, per GI   Hypoalbuminemia  --2/2 liver failure and poor oral intake   Acute pancreatitis/duodenitis -Presented with progressively worsening abdominal pain in the setting of heavy alcohol use -Lipase level elevated, liver enzymes elevated -CT abdomen findings are more suggestive of duodenitis and pancreatitis. -Patient was started on conservative management with IV fluid, IV albumin, pain medicines, antiemetics and PPI Plan: --IV dilaudid PRN   Acute kidney injury -Creatinine is unable to assess because of high bilirubin  level.  But clinically patient is making minimal urine output.  High risk of hepatorenal syndrome.   --had been on LR at 100 mill per hour  Plan: --cont LR @ 10 --cont foley   Essential hypertension, not currently active -Blood pressure in low normal range.  Currently Home meds on hold including amlodipine, HCTZ and lisinopril.   --cont to hold BP meds    Hyponatremia -Likely hypervolemic hyponatremia related to portal hypertension, probably also contributed by low solute intake in last 4 to 5 days. --cont LR @ 10   Chronic alcoholism   Obesity, BMI 44   Leukocytosis --likely due to steroid.  No fever.     Discharge Instructions  Discharge Instructions     Diet general   Complete by: As directed    As tolerated   Discharge instructions   Complete by: As directed    F/u w/ hospice provider ASAP   Increase activity slowly   Complete by: As directed       Allergies as of 04/14/2021   No Known Allergies      Medication List     TAKE these medications    amLODipine 5 MG tablet Commonly known as: NORVASC Take 5 mg by mouth every morning.   famotidine 20 MG tablet Commonly known as: PEPCID Take 20 mg by mouth 2 (two) times  daily.   gabapentin 300 MG capsule Commonly known as: NEURONTIN Take 900 mg by mouth 2 (two) times daily.   hydrochlorothiazide 25 MG tablet Commonly known as: HYDRODIURIL Take 25 mg by mouth every morning.   HYDROmorphone 1 MG/ML injection Commonly known as: DILAUDID Inject 2 mLs (2 mg total) into the vein every hour as needed for moderate pain or severe pain (dyspnea).   lisinopril 40 MG tablet Commonly known as: ZESTRIL Take 40 mg by mouth daily.   morphine CONCENTRATE 10 MG/0.5ML Soln concentrated solution Take 0.25 mLs (5 mg total) by mouth every 2 (two) hours as needed for severe pain, shortness of breath or anxiety.   ProAir HFA 108 (90 Base) MCG/ACT inhaler Generic drug: albuterol Inhale 1-2 puffs into the lungs every 4  (four) hours as needed.   Suboxone 8-2 MG Film Generic drug: Buprenorphine HCl-Naloxone HCl Place 1 Film under the tongue 3 (three) times daily.        No Known Allergies  Consultations: Hospice ID GI   Procedures/Studies: CT ABDOMEN PELVIS WO CONTRAST  Result Date: 04/02/2021 CLINICAL DATA:  Acute abdominal pain. Patient states he was unable to urinate for 5 days. Lower extremity edema. Vomiting and constipation. Jaundice. EXAM: CT ABDOMEN AND PELVIS WITHOUT CONTRAST TECHNIQUE: Multidetector CT imaging of the abdomen and pelvis was performed following the standard protocol without IV contrast. COMPARISON:  Remote abdominal CT 10/24/2010 FINDINGS: Lower chest: Trace left pleural effusion. Mild dependent atelectasis in both lower lobes. Heart is mildly enlarged. There is a hiatal hernia with herniation of fat and ascites through the diaphragmatic defect. Hepatobiliary: Diffusely decreased hepatic density typical of steatosis. The liver is enlarged spanning 20.7 cm cranial caudal. There is capsular nodularity. No obvious focal hepatic lesion on this unenhanced exam. Gallbladder is distended. No calcified gallstone. Common bile duct is not well-defined, however there is no biliary dilatation. Pancreas: Fatty atrophy of the pancreas. There is stranding adjacent to the duodenum and uncinate process of the pancreas, favored to be duodenal in origin rather than pancreatic. No pancreatic ductal dilatation. Spleen: Splenomegaly with spleen spanning 18 x 7.7 x 14.6 cm (volume = 1100 cm^3). No obvious focal abnormality on this unenhanced exam. Adrenals/Urinary Tract: No adrenal nodule. No hydronephrosis or renal calculi. Mild symmetric perinephric edema, nonspecific. No evidence of focal renal lesion. Urinary bladder is near completely empty. Stomach/Bowel: Bowel assessment is limited in the absence of enteric contrast and presence of ascites. Stomach mildly distended with fluid or ingested material. There  is a small hiatal hernia. 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 There is fat stranding and inflammatory changes adjacent to the distal duodenum, for example series 2, image 51. remainder of the small bowel is decompressed. No obstruction. The appendix is not definitively visualized on the current exam. Mild low-density wall thickening of the ascending colon. Remainder of the colon is decompressed and not well assessed. Vascular/Lymphatic: Normal caliber abdominal aorta with mild atherosclerosis. Left upper quadrant collaterals with varices coursing into the left lateral abdomen. Recannulated umbilical vein. There is no portal venous or mesenteric gas. Multiple prominent gastrohepatic and retroperitoneal nodes are likely reactive. There are few prominent bilateral inguinal nodes. Reproductive: Prostate is unremarkable. Other: Small volume abdominopelvic ascites with free fluid in the upper quadrants, both pericolic gutters, and in the pelvis. No free air or focal fluid collection. Right inguinal hernia contains fat and small volume ascites. Small left inguinal hernia contains fat. There is a small umbilical hernia with recannulized umbilical vein. Mild generalize subcutaneous edema. Musculoskeletal: Diffuse  degenerative change in the spine. Modic endplate changes at V69-I50. No focal bone lesion. IMPRESSION: 1. Fat stranding and inflammatory changes adjacent to the distal duodenum and uncinate process of the pancreas, favored to be duodenal in origin rather than pancreatic. Peptic ulcer disease or duodenitis are considered. Recommend correlation with pancreatic enzymes. 2. Hepatic cirrhosis with portal hypertension. Splenomegaly and portosystemic collaterals. Small volume abdominopelvic ascites. 3. Trace left pleural effusion. 4. Mild low-density wall thickening of the ascending colon, may be secondary to portal hypertension or colitis. 5. Bilateral inguinal hernias, right greater than left, containing fat and small  volume ascites. Small umbilical hernia with recannulized umbilical vein. 6. Hiatal hernia as well as fat and small volume ascites extending through the diaphragmatic hiatus. Aortic Atherosclerosis (ICD10-I70.0). Electronically Signed   By: Keith Rake M.D.   On: 04/02/2021 23:14   DG Abd 1 View  Result Date: 04/10/2021 CLINICAL DATA:  Abdominal pain EXAM: ABDOMEN - 1 VIEW COMPARISON:  CT of 04/02/2021 FINDINGS: Two supine views of the abdomen and pelvis. Cardiomegaly. No gross free intraperitoneal air. No gaseous distension of bowel loops. Exclusion of the low pelvis. Mild limitations secondary to patient body habitus. IMPRESSION: No evidence of bowel obstruction or gross free intraperitoneal air. Electronically Signed   By: Abigail Miyamoto M.D.   On: 04/10/2021 07:14   US Paracentesis  Result Date: 04/05/2021 CLINICAL DATA:  Ascites EXAM: LIMITED ABDOMEN ULTRASOUND FOR ASCITES TECHNIQUE: Limited ultrasound survey for ascites was performed in all four abdominal quadrants. COMPARISON:  CT abdomen pelvis 04/02/2021 FINDINGS: Minimal ascites noted in the left lower quadrant, insufficient for aspiration. IMPRESSION: Minimal left lower quadrant abdominal ascites, insufficient for paracentesis. Electronically Signed   By: Miachel Roux M.D.   On: 04/05/2021 09:57   DG Chest Portable 1 View  Result Date: 04/02/2021 CLINICAL DATA:  Cough. Patient reports he has been able to urinate for 5 days. Lower extremity edema. Shortness of breath. Abdominal pain. EXAM: PORTABLE CHEST 1 VIEW COMPARISON:  Radiograph 11/14/2015. FINDINGS: The heart is enlarged. There is vascular congestion. No focal airspace disease. No pleural fluid or pneumothorax. No acute osseous abnormalities are seen. IMPRESSION: Cardiomegaly with vascular congestion. Electronically Signed   By: Keith Rake M.D.   On: 04/02/2021 21:58      Subjective: Pt c/o pain    Discharge Exam: Vitals:   04/13/21 2046 04/14/21 0736  BP: (!) 96/40 (!)  92/44  Pulse: 78 72  Resp: 17 20  Temp: (!) 97.4 F (36.3 C) 97.6 F (36.4 C)  SpO2: 96% 98%   Vitals:   04/12/21 2335 04/13/21 0822 04/13/21 2046 04/14/21 0736  BP: 119/67 (!) 101/56 (!) 96/40 (!) 92/44  Pulse: 68 69 78 72  Resp: _0 Temp: 98.5 F (36.9 C) (!) 97.4 F (36.3 C) (!) 97.4 F (36.3 C) 97.6 F (36.4 C)  TempSrc:      SpO2: 94% 96% 96% 98%  Weight:      Height:        General: Pt is lethargic, scleral icterus, jaudince  Cardiovascular: S1/S2+. No rubs or clicks Respiratory: course breath sounds b/l Abdominal: Soft, obese, hypoactive bowel sounds  Extremities: b/l LE edema, no cyanosis    The results of significant diagnostics from this hospitalization (including imaging, microbiology, ancillary and laboratory) are listed below for reference.     Microbiology: No results found for this or any previous visit (from the past 240 hour(s)).   Labs: BNP (last 3 results) Recent Labs  04/02/21 2151  BNP 93.7   Basic Metabolic Panel: Recent Labs  Lab 04/08/21 0319 04/09/21 0440 04/10/21 0641 04/11/21 0632 04/12/21 0647  NA 129* 129* 129* 133* 130*  K 3.5 3.5 3.8 3.4* 3.8  CL 94* 96* 97* 98 98  CO2 21* 22 19* 21* 19*  GLUCOSE 122* 128* 144* 136* 155*  BUN 106* 121* 128* 131* 152*  CREATININE UNABLE TO REPORT DUE TO ICTERUS UNABLE TO REPORT DUE TO ICTERUS.PMF UNABLE TO REPORT DUE TO ICTERUS UNABLE TO REPORT DUE TO ICTERUS UNABLE TO REPORT DUE TO ICTERUS  CALCIUM 7.9* 8.1* 8.4* 8.6* 8.3*  MG 2.8* 3.0* 3.3* 3.3* 3.4*  PHOS UNABLE TO REPORT DUE TO ICTERUS  --   --   --   --    Liver Function Tests: Recent Labs  Lab 04/08/21 0319 04/09/21 0440 04/10/21 0641 04/11/21 0632 04/12/21 0647  AST 179* 176* 170* 178* 208*  ALT 97* 102* 108* 113* 130*  ALKPHOS 93 96 94 83 87  BILITOT 32.7* 29.3* 36.2* 33.2* 39.2*  PROT 6.7 6.5 6.5 6.3* 5.9*  ALBUMIN 1.8* 1.8* 2.2* 2.2* 2.3*   No results for input(s): LIPASE, AMYLASE in the last 168  hours. No results for input(s): AMMONIA in the last 168 hours. CBC: Recent Labs  Lab 04/08/21 0319 04/09/21 0440 04/10/21 0641 04/11/21 0632 04/12/21 0647  WBC 11.2* 11.7* 16.0* 16.8* 24.8*  HGB 13.6 13.5 15.0 15.5 12.4*  HCT 36.4* 36.4* 41.8 41.6 33.4*  MCV 97.8 98.1 104.0* 98.9 97.9  PLT 62* 48* 45* 44* 32*   Cardiac Enzymes: No results for input(s): CKTOTAL, CKMB, CKMBINDEX, TROPONINI in the last 168 hours. BNP: Invalid input(s): POCBNP CBG: Recent Labs  Lab 04/11/21 2005  GLUCAP 141*   D-Dimer No results for input(s): DDIMER in the last 72 hours. Hgb A1c No results for input(s): HGBA1C in the last 72 hours. Lipid Profile No results for input(s): CHOL, HDL, LDLCALC, TRIG, CHOLHDL, LDLDIRECT in the last 72 hours. Thyroid function studies No results for input(s): TSH, T4TOTAL, T3FREE, THYROIDAB in the last 72 hours.  Invalid input(s): FREET3 Anemia work up No results for input(s): VITAMINB12, FOLATE, FERRITIN, TIBC, IRON, RETICCTPCT in the last 72 hours. Urinalysis    Component Value Date/Time   COLORURINE AMBER (A) 04/02/2021 2107   APPEARANCEUR CLOUDY (A) 04/02/2021 2107   LABSPEC 1.012 04/02/2021 2107   PHURINE 5.0 04/02/2021 2107   GLUCOSEU 50 (A) 04/02/2021 2107   HGBUR MODERATE (A) 04/02/2021 2107   BILIRUBINUR MODERATE (A) 04/02/2021 2107   [place] NEGATIVE 04/02/2021 2107   PROTEINUR NEGATIVE 04/02/2021 2107   NITRITE NEGATIVE 04/02/2021 2107   LEUKOCYTESUR NEGATIVE 04/02/2021 2107   Sepsis Labs Invalid input(s): PROCALCITONIN,  WBC,  LACTICIDVEN Microbiology No results found for this or any previous visit (from the past 240 hour(s)).   Time coordinating discharge: Over 30 minutes  SIGNED:   Wyvonnia Dusky, MD  [facility] 04/14/2021, 12:33 PM Pager   If 7PM-7AM, please contact night-coverage

## 2021-04-15 LAB — VARICELLA-ZOSTER BY PCR: Varicella-Zoster, PCR: NEGATIVE
